# Patient Record
Sex: Female | Born: 1955 | Race: Black or African American | Hispanic: No | Marital: Married | Smoking: Current every day smoker
Health system: Southern US, Community
[De-identification: ages and names within clinical notes are randomized; demographics above are authoritative.]

## PROBLEM LIST (undated history)

## (undated) DIAGNOSIS — W19XXXA Unspecified fall, initial encounter: Secondary | ICD-10-CM

## (undated) DIAGNOSIS — Y92009 Unspecified place in unspecified non-institutional (private) residence as the place of occurrence of the external cause: Secondary | ICD-10-CM

## (undated) DIAGNOSIS — F250 Schizoaffective disorder, bipolar type: Secondary | ICD-10-CM

## (undated) DIAGNOSIS — K859 Acute pancreatitis without necrosis or infection, unspecified: Secondary | ICD-10-CM

## (undated) DIAGNOSIS — F79 Unspecified intellectual disabilities: Secondary | ICD-10-CM

## (undated) DIAGNOSIS — I10 Essential (primary) hypertension: Secondary | ICD-10-CM

## (undated) DIAGNOSIS — F319 Bipolar disorder, unspecified: Secondary | ICD-10-CM

## (undated) DIAGNOSIS — J449 Chronic obstructive pulmonary disease, unspecified: Secondary | ICD-10-CM

## (undated) HISTORY — PX: OTHER SURGICAL HISTORY: SHX169

## (undated) HISTORY — DX: Unspecified fall, initial encounter: W19.XXXA

## (undated) HISTORY — DX: Unspecified fall, initial encounter: Y92.009

## (undated) HISTORY — DX: Unspecified intellectual disabilities: F79

## (undated) HISTORY — DX: Chronic obstructive pulmonary disease, unspecified: J44.9

---

## 2004-04-04 ENCOUNTER — Ambulatory Visit: Payer: Self-pay | Admitting: Family Medicine

## 2011-01-17 ENCOUNTER — Ambulatory Visit (HOSPITAL_COMMUNITY)
Admission: RE | Admit: 2011-01-17 | Discharge: 2011-01-17 | Disposition: A | Discharge status: Home / Self Care | Payer: Medicaid Other | Attending: Psychiatry | Admitting: Psychiatry

## 2011-01-17 DIAGNOSIS — F39 Unspecified mood [affective] disorder: Secondary | ICD-10-CM | POA: Insufficient documentation

## 2011-03-23 ENCOUNTER — Emergency Department (HOSPITAL_COMMUNITY)
Admission: EM | Admit: 2011-03-23 | Discharge: 2011-03-23 | Disposition: A | Discharge status: Home / Self Care | Payer: Medicaid Other | Attending: Emergency Medicine | Admitting: Emergency Medicine

## 2011-03-23 ENCOUNTER — Encounter: Payer: Self-pay | Admitting: Emergency Medicine

## 2011-03-23 DIAGNOSIS — F29 Unspecified psychosis not due to a substance or known physiological condition: Secondary | ICD-10-CM | POA: Insufficient documentation

## 2011-03-23 HISTORY — DX: Schizoaffective disorder, bipolar type: F25.0

## 2011-03-23 HISTORY — DX: Bipolar disorder, unspecified: F31.9

## 2011-03-23 MED ORDER — HALOPERIDOL DECANOATE 100 MG/ML IM SOLN
100.0000 mg | Freq: Once | INTRAMUSCULAR | Status: AC
  Administered 2011-03-23: 100 mg via INTRAMUSCULAR
  Filled 2011-03-23: qty 1

## 2011-03-23 NOTE — ED Provider Notes (Signed)
History     CSN: [id number] Arrival date & time: 03/23/2011  2:15 PM   First MD Initiated Contact with Patient 03/23/11 1500      Chief Complaint  Patient presents with  . Medication Problem    Pt here for dose of Haldol    (Consider location/radiation/quality/duration/timing/severity/associated sxs/prior treatment) HPI The patient was seen by her psychiatrist today and was prescribed haloperidol decanoate to be started today.  Patient to the pharmacy and it was unavailable and thus the patient was asked by her psychiatrist come to the ER for an IM injection of the haloperidol.  The psychiatrist asked that no further evaluation be done as he is following this as an outpatient.  The patient is with her daughter who reports safety at home.  No other complaints  Past Medical History  Diagnosis Date  . Diabetes mellitus   . Bipolar 1 disorder   . Schizoaffective disorder, bipolar type     History reviewed. No pertinent past surgical history.  History reviewed. No pertinent family history.  History  Substance Use Topics  . Smoking status: Current Everyday Smoker  . Smokeless tobacco: Never Used  . Alcohol Use: Yes    OB History    Grav Para Term Preterm Abortions TAB SAB Ect Mult Living                  Review of Systems  All other systems reviewed and are negative.    Allergies  Review of patient's allergies indicates no known allergies.  Home Medications   Current Outpatient Rx  Name Route Sig Dispense Refill  . ACETAMINOPHEN 500 MG PO TABS Oral Take 1,000 mg by mouth 2 (two) times daily as needed. For back pain.     . ARIPIPRAZOLE 5 MG PO TABS Oral Take 5 mg by mouth 2 (two) times daily.      Marland Kitchen LORAZEPAM 1 MG PO TABS Oral Take 1 mg by mouth every 8 (eight) hours.      . TRAZODONE HCL 150 MG PO TABS Oral Take 150 mg by mouth at bedtime.        BP 154/101  Pulse 89  Temp(Src) 98.5 F (36.9 C) (Oral)  Resp 20  SpO2 96%  Physical Exam  Constitutional:  She is oriented to person, place, and time. She appears well-developed and well-nourished.  HENT:  Head: Normocephalic.  Eyes: EOM are normal.  Neck: Normal range of motion.  Pulmonary/Chest: Effort normal.  Musculoskeletal: Normal range of motion.  Neurological: She is alert and oriented to person, place, and time.  Psychiatric: She has a normal mood and affect.    ED Course  Procedures (including critical care time)  Labs Reviewed - No data to display No results found.   1. Psychosis       MDM  100 IM haloperidol Deconate was given as requested by her psychiatrist. Patient will be discharged home in the care of her family        Lyanne Co, MD 03/23/11 1610

## 2011-03-23 NOTE — ED Notes (Signed)
Pt sent here by [facility] for Haldol. Pt attempted to get medication from pharmacy but the pharmacy did not have it.

## 2011-05-06 ENCOUNTER — Emergency Department (HOSPITAL_COMMUNITY)
Admission: EM | Admit: 2011-05-06 | Discharge: 2011-05-07 | Disposition: A | Discharge status: Other Acute Inpatient Hospital | Payer: Medicaid Other | Attending: Internal Medicine | Admitting: Internal Medicine

## 2011-05-06 ENCOUNTER — Encounter (HOSPITAL_COMMUNITY): Payer: Self-pay | Admitting: Emergency Medicine

## 2011-05-06 DIAGNOSIS — R42 Dizziness and giddiness: Secondary | ICD-10-CM | POA: Diagnosis not present

## 2011-05-06 DIAGNOSIS — Z79899 Other long term (current) drug therapy: Secondary | ICD-10-CM

## 2011-05-06 DIAGNOSIS — Z5900 Homelessness unspecified: Secondary | ICD-10-CM

## 2011-05-06 DIAGNOSIS — I10 Essential (primary) hypertension: Secondary | ICD-10-CM | POA: Diagnosis present

## 2011-05-06 DIAGNOSIS — F259 Schizoaffective disorder, unspecified: Secondary | ICD-10-CM | POA: Diagnosis present

## 2011-05-06 DIAGNOSIS — F209 Schizophrenia, unspecified: Secondary | ICD-10-CM | POA: Insufficient documentation

## 2011-05-06 DIAGNOSIS — F411 Generalized anxiety disorder: Secondary | ICD-10-CM | POA: Insufficient documentation

## 2011-05-06 DIAGNOSIS — A088 Other specified intestinal infections: Secondary | ICD-10-CM | POA: Diagnosis present

## 2011-05-06 DIAGNOSIS — F319 Bipolar disorder, unspecified: Secondary | ICD-10-CM | POA: Diagnosis present

## 2011-05-06 DIAGNOSIS — K862 Cyst of pancreas: Principal | ICD-10-CM | POA: Diagnosis present

## 2011-05-06 DIAGNOSIS — F1020 Alcohol dependence, uncomplicated: Secondary | ICD-10-CM | POA: Diagnosis present

## 2011-05-06 DIAGNOSIS — E119 Type 2 diabetes mellitus without complications: Secondary | ICD-10-CM | POA: Insufficient documentation

## 2011-05-06 DIAGNOSIS — J069 Acute upper respiratory infection, unspecified: Secondary | ICD-10-CM | POA: Diagnosis present

## 2011-05-06 DIAGNOSIS — F79 Unspecified intellectual disabilities: Secondary | ICD-10-CM | POA: Diagnosis present

## 2011-05-06 DIAGNOSIS — F1210 Cannabis abuse, uncomplicated: Secondary | ICD-10-CM | POA: Diagnosis present

## 2011-05-06 DIAGNOSIS — F17200 Nicotine dependence, unspecified, uncomplicated: Secondary | ICD-10-CM | POA: Diagnosis present

## 2011-05-06 DIAGNOSIS — K859 Acute pancreatitis without necrosis or infection, unspecified: Secondary | ICD-10-CM | POA: Diagnosis present

## 2011-05-06 LAB — COMPREHENSIVE METABOLIC PANEL
ALT: 25 U/L (ref 0–35)
AST: 27 U/L (ref 0–37)
Albumin: 4 g/dL (ref 3.5–5.2)
Alkaline Phosphatase: 80 U/L (ref 39–117)
BUN: 8 mg/dL (ref 6–23)
CO2: 28 mEq/L (ref 19–32)
Calcium: 10.1 mg/dL (ref 8.4–10.5)
Chloride: 99 mEq/L (ref 96–112)
Creatinine, Ser: 0.91 mg/dL (ref 0.50–1.10)
GFR calc Af Amer: 80 mL/min — ABNORMAL LOW (ref >90)
GFR calc non Af Amer: 69 mL/min — ABNORMAL LOW (ref >90)
Glucose, Bld: 115 mg/dL — ABNORMAL HIGH (ref 70–99)
Potassium: 3.5 mEq/L (ref 3.5–5.1)
Sodium: 137 mEq/L (ref 135–145)
Total Bilirubin: 0.6 mg/dL (ref 0.3–1.2)
Total Protein: 7.5 g/dL (ref 6.0–8.3)

## 2011-05-06 LAB — ETHANOL: Alcohol, Ethyl (B): <11 mg/dL (ref 0–11)

## 2011-05-06 LAB — CBC
HCT: 42.3 % (ref 36.0–46.0)
Hemoglobin: 15.4 g/dL — ABNORMAL HIGH (ref 12.0–15.0)
MCH: 30.6 pg (ref 26.0–34.0)
MCHC: 36.4 g/dL — ABNORMAL HIGH (ref 30.0–36.0)
MCV: 83.9 fL (ref 78.0–100.0)
Platelets: 254 10*3/uL (ref 150–400)
RBC: 5.04 MIL/uL (ref 3.87–5.11)
RDW: 13 % (ref 11.5–15.5)
WBC: 7.2 10*3/uL (ref 4.0–10.5)

## 2011-05-06 LAB — DIFFERENTIAL
Eosinophils Relative: 1 % (ref 0–5)
Lymphocytes Relative: 46 % (ref 12–46)
Monocytes Absolute: 0.3 10*3/uL (ref 0.1–1.0)
Monocytes Relative: 5 % (ref 3–12)
Neutro Abs: 3.4 10*3/uL (ref 1.7–7.7)

## 2011-05-06 LAB — RAPID URINE DRUG SCREEN, HOSP PERFORMED
Barbiturates: NOT DETECTED
Benzodiazepines: NOT DETECTED
Cocaine: NOT DETECTED

## 2011-05-06 LAB — ACETAMINOPHEN LEVEL: Acetaminophen (Tylenol), Serum: <15 ug/mL (ref 10–30)

## 2011-05-06 LAB — GLUCOSE, CAPILLARY: Glucose-Capillary: 112 mg/dL — ABNORMAL HIGH (ref 70–99)

## 2011-05-06 LAB — URINALYSIS, ROUTINE W REFLEX MICROSCOPIC
Ketones, ur: NEGATIVE mg/dL
Leukocytes, UA: NEGATIVE
Nitrite: NEGATIVE
Protein, ur: NEGATIVE mg/dL
Urobilinogen, UA: 1 mg/dL (ref 0.0–1.0)

## 2011-05-06 MED ORDER — ARIPIPRAZOLE 5 MG PO TABS
5.0000 mg | ORAL_TABLET | Freq: Two times a day (BID) | ORAL | Status: DC
  Administered 2011-05-06 (×2): 5 mg via ORAL
  Filled 2011-05-06 (×4): qty 1

## 2011-05-06 MED ORDER — ACETAMINOPHEN 500 MG PO TABS: 1000.0000 mg | ORAL_TABLET | Freq: Two times a day (BID) | ORAL | Status: DC | PRN

## 2011-05-06 MED ORDER — ALUM & MAG HYDROXIDE-SIMETH 200-200-20 MG/5ML PO SUSP: 30.0000 mL | ORAL | Status: DC | PRN

## 2011-05-06 MED ORDER — LORAZEPAM 1 MG PO TABS
1.0000 mg | ORAL_TABLET | Freq: Three times a day (TID) | ORAL | Status: DC | PRN
  Administered 2011-05-06: 1 mg via ORAL
  Filled 2011-05-06: qty 1

## 2011-05-06 MED ORDER — ONDANSETRON HCL 4 MG PO TABS: 4.0000 mg | ORAL_TABLET | Freq: Three times a day (TID) | ORAL | Status: DC | PRN

## 2011-05-06 MED ORDER — INSULIN ASPART 100 UNIT/ML ~~LOC~~ SOLN
0.0000 [IU] | SUBCUTANEOUS | Status: DC
  Administered 2011-05-07: 2 [IU] via SUBCUTANEOUS
  Filled 2011-05-06: qty 3

## 2011-05-06 MED ORDER — HALOPERIDOL LACTATE 5 MG/ML IJ SOLN
5.0000 mg | Freq: Once | INTRAMUSCULAR | Status: AC
  Administered 2011-05-06: 5 mg via INTRAMUSCULAR
  Filled 2011-05-06: qty 1

## 2011-05-06 MED ORDER — TRAZODONE HCL 50 MG PO TABS
150.0000 mg | ORAL_TABLET | Freq: Every day | ORAL | Status: DC
  Administered 2011-05-06: 150 mg via ORAL
  Filled 2011-05-06: qty 1

## 2011-05-06 NOTE — ED Provider Notes (Signed)
History     CSN: [id number]  Arrival date & time 05/06/11  0865   First MD Initiated Contact with Patient 05/06/11 0606      Chief Complaint  Patient presents with  . Medical Clearance    (Consider location/radiation/quality/duration/timing/severity/associated sxs/prior treatment) HPI History is obtained from patient and [employer]. The patient was brought to the ED by [facility] officers; she has a history of bipolar and schizoaffective disorder and lives with her daughter. She apparently has not been taking her medications for about a week and a half; her behavior has caused her daughter to kick her out of the house. When asked why she has not been taking her medication, she states "this does kill me." States that she has had some suicidal ideation, but denies any plan. She has not had any homicidal ideation. Denies any hallucinations. She denies any physical complaints at this time.  No family members with pt. She does not present with IVC papers.  Past Medical History  Diagnosis Date  . Diabetes mellitus   . Bipolar 1 disorder   . Schizoaffective disorder, bipolar type     Past Surgical History  Procedure Date  . Arm surgery     History reviewed. No pertinent family history.  History  Substance Use Topics  . Smoking status: Current Everyday Smoker  . Smokeless tobacco: Never Used  . Alcohol Use: Yes    OB History    Grav Para Term Preterm Abortions TAB SAB Ect Mult Living                  Review of Systems  Constitutional: Negative for fever, chills, activity change and appetite change.  HENT: Negative for congestion, sore throat, rhinorrhea and postnasal drip.   Respiratory: Negative for cough.   Cardiovascular: Negative for chest pain and palpitations.  Gastrointestinal: Negative for nausea, vomiting and abdominal pain.  Psychiatric/Behavioral: Positive for suicidal ideas and behavioral problems. Negative for hallucinations, sleep disturbance and self-injury. The patient  is nervous/anxious.     Allergies  Review of patient's allergies indicates no known allergies.  Home Medications   Current Outpatient Rx  Name Route Sig Dispense Refill  . ACETAMINOPHEN 500 MG PO TABS Oral Take 1,000 mg by mouth 2 (two) times daily as needed. For back pain.     . ARIPIPRAZOLE 5 MG PO TABS Oral Take 5 mg by mouth 2 (two) times daily.      Marland Kitchen LORAZEPAM 1 MG PO TABS Oral Take 1 mg by mouth every 8 (eight) hours.      . TRAZODONE HCL 150 MG PO TABS Oral Take 150 mg by mouth at bedtime.        BP 173/108  Pulse 88  Temp(Src) 98.9 F (37.2 C) (Oral)  Resp 20  SpO2 97%  Physical Exam  Nursing note and vitals reviewed. Constitutional: She appears well-developed and well-nourished. No distress.  HENT:  Head: Normocephalic and atraumatic.  Right Ear: External ear normal.  Left Ear: External ear normal.  Eyes: Conjunctivae are normal. Pupils are equal, round, and reactive to light.  Neck: Normal range of motion. Neck supple.  Cardiovascular: Normal rate, regular rhythm and normal heart sounds.   Pulmonary/Chest: Effort normal and breath sounds normal.  Abdominal: Soft. Bowel sounds are normal. There is no tenderness. There is no rebound.  Neurological: She is alert.  Skin: Skin is warm and dry. No rash noted. She is not diaphoretic.  Psychiatric: Her mood appears anxious. Her speech is rapid and/or  pressured. She is agitated. She is not aggressive, is not hyperactive and not combative. She expresses impulsivity and inappropriate judgment. She expresses suicidal ideation. She expresses no homicidal ideation. She expresses no suicidal plans and no homicidal plans.       Paranoid    ED Course  Procedures (including critical care time)  Labs Reviewed  CBC - Abnormal; Notable for the following:    Hemoglobin 15.4 (*)    MCHC 36.4 (*)    All other components within normal limits  COMPREHENSIVE METABOLIC PANEL - Abnormal; Notable for the following:    Glucose, Bld 115  (*)    GFR calc non Af Amer 69 (*)    GFR calc Af Amer 80 (*)    All other components within normal limits  DIFFERENTIAL  ACETAMINOPHEN LEVEL  URINALYSIS, ROUTINE W REFLEX MICROSCOPIC  ETHANOL  URINE RAPID DRUG SCREEN (HOSP PERFORMED)   No results found.   No diagnosis found.    MDM  9:16 AM Patient was cleared from a medical standpoint. She was moved to bed 10 as there were no psych ED beds available. She was attempting to leave the room, making inappropriate comments, and perseverating on a religious theme. She made the comment that "doctors are the devil" and generally seems paranoid. Dr. Fonnie Jarvis saw the pt; as she has not been taking her medication, appears to be psychotic and has no family members with her, IVC papers have been completed. Haldol and sitter ordered. Call placed to [facility].  9:43 AM Received callback from [facility] with [facility]. She will assess. Pt to be moved to psych ED when bed available.    Grant Fontana, [place] 05/06/11 1216   Medical screening examination/treatment/procedure(s) were performed by non-physician practitioner and as supervising physician I was immediately available for consultation/collaboration.   Sunnie Nielsen, MD 05/06/11 [phone]

## 2011-05-06 NOTE — ED Notes (Signed)
Pt brought to the ER by [facility] officers, pt lives w/ daughter, per [employer] daughter has kicked pt out of her home. Pt has hx of schizophrenia and bipolar disorder, Hx of diabetes, she has not taken home meds since past Wed. Pt states that "those pills kill me". Pt denies any SI/HI ideations, states " i am a good girl", "I believe in Jesus"

## 2011-05-06 NOTE — ED Notes (Signed)
Pt. Easily excitable---appears manic---singing, dancing---tech in with patient to keep her in treatment area.  Tech with very good rapport with patient and assist in calming her.

## 2011-05-06 NOTE — ED Notes (Signed)
Received pt from Evangeline Gula Pt oriented to unit snack given

## 2011-05-06 NOTE — ED Notes (Signed)
Report called to [facility] and pt. Ambulated down to [facility] ED.  Belongings taken--given to nurse.

## 2011-05-06 NOTE — BH Assessment (Signed)
Assessment Note   [name] is an 55 y.o. female with a lengthy history of BiPolar and Schizoaffective Disorder.  Pt was brought in by [employer] after daughter alledgedly kicked pt out of the home.  Pt has refused her medication for over a week and is abusing ETOH and Cannibus.  Pt is now paranoid, agitated, and unable to contract for safety. Pt is not oriented and has been placed on IVC.  Pt is eating and drinking, but was dancing and becoming agitated in her room earlier and was given Geodon.  When assessed, pt was resting quietly in her room.  Pt denies SI or HI.  Pt is a poor historian regarding past hx of treatment though currently seen, is not compliant.   Pt is in need of inpatient treatment to address current symptoms.  Please run.  Axis I: Bipolar, mixed and Schizoaffective Disorder Axis II: Deferred Axis III:  Past Medical History  Diagnosis Date  . Diabetes mellitus   . Bipolar 1 disorder   . Schizoaffective disorder, bipolar type    Axis IV: other psychosocial or environmental problems, problems related to social environment and problems with primary support group Axis V: 21-30 behavior considerably influenced by delusions or hallucinations OR serious impairment in judgment, communication OR inability to function in almost all areas  Past Medical History:  Past Medical History  Diagnosis Date  . Diabetes mellitus   . Bipolar 1 disorder   . Schizoaffective disorder, bipolar type     Past Surgical History  Procedure Date  . Arm surgery     Family History: History reviewed. No pertinent family history.  Social History:  reports that she has been smoking.  She has never used smokeless tobacco. She reports that she drinks alcohol. She reports that she uses illicit drugs (Marijuana).  Additional Social History:    Allergies: No Known Allergies  Home Medications:  Medications Prior to Admission  Medication Dose Route Frequency Provider Last Rate Last Dose  . acetaminophen  (TYLENOL) tablet 1,000 mg  1,000 mg Oral BID PRN Grant Fontana, PA      . alum & mag hydroxide-simeth (MAALOX/MYLANTA) 200-200-20 MG/5ML suspension 30 mL  30 mL Oral PRN Grant Fontana, PA      . ARIPiprazole (ABILIFY) tablet 5 mg  5 mg Oral BID Grant Fontana, PA   5 mg at 05/06/11 0959  . haloperidol lactate (HALDOL) injection 5 mg  5 mg Intramuscular Once Hurman Horn, MD   5 mg at 05/06/11 1610  . LORazepam (ATIVAN) tablet 1 mg  1 mg Oral Q8H PRN Grant Fontana, PA   1 mg at 05/06/11 1028  . ondansetron (ZOFRAN) tablet 4 mg  4 mg Oral Q8H PRN Grant Fontana, PA      . traZODone (DESYREL) tablet 150 mg  150 mg Oral QHS Grant Fontana, [place]       Medications Prior to Admission  Medication Sig Dispense Refill  . acetaminophen (TYLENOL) 500 MG tablet Take 1,000 mg by mouth 2 (two) times daily as needed. For back pain.       . ARIPiprazole (ABILIFY) 5 MG tablet Take 5 mg by mouth 2 (two) times daily.        Marland Kitchen LORazepam (ATIVAN) 1 MG tablet Take 1 mg by mouth every 8 (eight) hours.        . traZODone (DESYREL) 150 MG tablet Take 150 mg by mouth at bedtime.          OB/GYN Status:  No  LMP recorded. Patient is postmenopausal.                 Mental Status Report Motor Activity: Agitation;Restlessness                     Values / Beliefs Cultural Requests During Hospitalization: None Spiritual Requests During Hospitalization: None              Disposition:     On Site Evaluation by:   Reviewed with Physician:     Angelica Ran 05/06/2011 2:12 PM

## 2011-05-06 NOTE — Progress Notes (Signed)
Addendum to [facility] Note:  Pt's Community Support Lead from [employer] is present.  Her name is Melynda Keller and she can reached at [phone].  Ms. Harlene Salts informs Clinical research associate that she's been non-compliant with her medication "for some time now and she's drinking alcohol daily."  She receives her medication management through [facility]  Ms. Langhorne further reports that she has a screening through [facility] scheduled for Wednesday, January 2nd and that they are also looking for long-term placement for her, as she is too acute to live on her own.  Pt's daughter is unable to handle pt any longer as well.  Verdene Lennert Aleera Gilcrease, LPC 1515

## 2011-05-06 NOTE — ED Notes (Signed)
Pt belongings placed in activity room with locked door. Three bags

## 2011-05-06 NOTE — ED Notes (Signed)
Pt wearing blanket on head, has turned keyboard upside down, making inappropriate comments.

## 2011-05-06 NOTE — ED Notes (Signed)
Pt. Up in room--dancing--walking into pt. Next doors room---taken back to her treatment room--TV on and channel changed to pt's choice

## 2011-05-06 NOTE — ED Notes (Signed)
Pt. Sleeping on carrier--awakens easily with verbal and up to bathroom--back to carrier.  Tech with patient

## 2011-05-06 NOTE — ED Provider Notes (Signed)
This 55 year old female has a history of bipolar disorder as well as schizophrenia and self-admittedly is noncompliant with medications, apparently her daughter called the police to bring the patient emergency room, the patient is anxious paranoid delusional and psychotic believing that her psychiatrist and psychiatrist other double, that the doctors are trying to kill her and any shots but the doctors may give her will try to kill her, she told the physician assistant the patient was suicidal but now denies that to me she denies any threats or herself or others however she has very poor insight and poor judgment and does not understand her need for treatment, she refuses to take any oral medicines or injections thinking that we are trying to kill her. She is perseverating on religious conversations. Because she wants to leave the emergency department and I cannot guarantee her safety and there is no family with her I believe involuntary commitment is reasonable and I completed a petition at this time to keep the patient in the emergency department.1610  Medical screening examination/treatment/procedure(s) were conducted as a shared visit with non-physician practitioner(s) and myself.  I personally evaluated the patient during the encounter  Hurman Horn, MD 05/07/11 [phone]

## 2011-05-07 ENCOUNTER — Inpatient Hospital Stay (HOSPITAL_COMMUNITY)
Admission: EM | Admit: 2011-05-07 | Discharge: 2011-05-10 | DRG: 438 | Disposition: A | Discharge status: Other Acute Inpatient Hospital | Payer: Medicaid Other | Attending: Internal Medicine | Admitting: Internal Medicine

## 2011-05-07 ENCOUNTER — Encounter (HOSPITAL_COMMUNITY): Payer: Self-pay | Admitting: *Deleted

## 2011-05-07 DIAGNOSIS — R112 Nausea with vomiting, unspecified: Secondary | ICD-10-CM | POA: Diagnosis present

## 2011-05-07 DIAGNOSIS — R42 Dizziness and giddiness: Secondary | ICD-10-CM | POA: Diagnosis present

## 2011-05-07 DIAGNOSIS — K859 Acute pancreatitis without necrosis or infection, unspecified: Secondary | ICD-10-CM | POA: Diagnosis present

## 2011-05-07 DIAGNOSIS — F250 Schizoaffective disorder, bipolar type: Secondary | ICD-10-CM | POA: Diagnosis present

## 2011-05-07 DIAGNOSIS — R748 Abnormal levels of other serum enzymes: Secondary | ICD-10-CM | POA: Diagnosis present

## 2011-05-07 DIAGNOSIS — R1013 Epigastric pain: Secondary | ICD-10-CM | POA: Diagnosis present

## 2011-05-07 DIAGNOSIS — R509 Fever, unspecified: Secondary | ICD-10-CM

## 2011-05-07 DIAGNOSIS — J069 Acute upper respiratory infection, unspecified: Secondary | ICD-10-CM | POA: Diagnosis present

## 2011-05-07 DIAGNOSIS — F17200 Nicotine dependence, unspecified, uncomplicated: Secondary | ICD-10-CM | POA: Diagnosis present

## 2011-05-07 DIAGNOSIS — F1210 Cannabis abuse, uncomplicated: Secondary | ICD-10-CM | POA: Diagnosis present

## 2011-05-07 DIAGNOSIS — F1020 Alcohol dependence, uncomplicated: Secondary | ICD-10-CM | POA: Diagnosis present

## 2011-05-07 DIAGNOSIS — F79 Unspecified intellectual disabilities: Secondary | ICD-10-CM | POA: Diagnosis present

## 2011-05-07 DIAGNOSIS — E119 Type 2 diabetes mellitus without complications: Secondary | ICD-10-CM | POA: Diagnosis present

## 2011-05-07 DIAGNOSIS — R197 Diarrhea, unspecified: Secondary | ICD-10-CM

## 2011-05-07 DIAGNOSIS — F1994 Other psychoactive substance use, unspecified with psychoactive substance-induced mood disorder: Secondary | ICD-10-CM

## 2011-05-07 DIAGNOSIS — F1910 Other psychoactive substance abuse, uncomplicated: Secondary | ICD-10-CM | POA: Diagnosis present

## 2011-05-07 DIAGNOSIS — K863 Pseudocyst of pancreas: Secondary | ICD-10-CM | POA: Diagnosis present

## 2011-05-07 DIAGNOSIS — F329 Major depressive disorder, single episode, unspecified: Secondary | ICD-10-CM

## 2011-05-07 DIAGNOSIS — J029 Acute pharyngitis, unspecified: Secondary | ICD-10-CM | POA: Diagnosis present

## 2011-05-07 DIAGNOSIS — J9801 Acute bronchospasm: Secondary | ICD-10-CM | POA: Diagnosis present

## 2011-05-07 HISTORY — DX: Essential (primary) hypertension: I10

## 2011-05-07 LAB — HEMOGLOBIN A1C
Hgb A1c MFr Bld: 6.4 % — ABNORMAL HIGH (ref <5.7)
Mean Plasma Glucose: 137 mg/dL — ABNORMAL HIGH (ref <117)

## 2011-05-07 LAB — COMPREHENSIVE METABOLIC PANEL
Alkaline Phosphatase: 70 U/L (ref 39–117)
BUN: 13 mg/dL (ref 6–23)
Chloride: 98 mEq/L (ref 96–112)
GFR calc non Af Amer: 58 mL/min — ABNORMAL LOW (ref >90)
Glucose, Bld: 92 mg/dL (ref 70–99)
Potassium: 3.9 mEq/L (ref 3.5–5.1)
Total Bilirubin: 0.3 mg/dL (ref 0.3–1.2)

## 2011-05-07 LAB — GLUCOSE, CAPILLARY: Glucose-Capillary: 110 mg/dL — ABNORMAL HIGH (ref 70–99)

## 2011-05-07 MED ORDER — METFORMIN HCL 500 MG PO TABS
500.0000 mg | ORAL_TABLET | Freq: Every day | ORAL | Status: DC
  Administered 2011-05-08: 500 mg via ORAL
  Filled 2011-05-07 (×2): qty 1

## 2011-05-07 MED ORDER — VITAMIN B-1 100 MG PO TABS
100.0000 mg | ORAL_TABLET | Freq: Every day | ORAL | Status: DC
  Administered 2011-05-07 – 2011-05-10 (×4): 100 mg via ORAL
  Filled 2011-05-07 (×5): qty 1

## 2011-05-07 MED ORDER — ADULT MULTIVITAMIN W/MINERALS CH
1.0000 | ORAL_TABLET | Freq: Every day | ORAL | Status: DC
  Administered 2011-05-07 – 2011-05-10 (×4): 1 via ORAL
  Filled 2011-05-07 (×4): qty 1

## 2011-05-07 MED ORDER — THIAMINE HCL 100 MG/ML IJ SOLN
100.0000 mg | Freq: Every day | INTRAMUSCULAR | Status: DC
  Administered 2011-05-07: 100 mg via INTRAVENOUS

## 2011-05-07 MED ORDER — INFLUENZA VIRUS VACC SPLIT PF IM SUSP: 0.5000 mL | INTRAMUSCULAR | Status: AC

## 2011-05-07 MED ORDER — ALUM & MAG HYDROXIDE-SIMETH 200-200-20 MG/5ML PO SUSP: 30.0000 mL | ORAL | Status: DC | PRN

## 2011-05-07 MED ORDER — LISINOPRIL 10 MG PO TABS
10.0000 mg | ORAL_TABLET | Freq: Every day | ORAL | Status: DC
  Administered 2011-05-07 – 2011-05-15 (×9): 10 mg via ORAL
  Filled 2011-05-07 (×11): qty 1

## 2011-05-07 MED ORDER — HALOPERIDOL LACTATE 5 MG/ML IJ SOLN
5.0000 mg | Freq: Once | INTRAMUSCULAR | Status: AC
  Administered 2011-05-07: 5 mg via INTRAMUSCULAR
  Filled 2011-05-07: qty 1

## 2011-05-07 MED ORDER — LORAZEPAM 1 MG PO TABS: 1.0000 mg | ORAL_TABLET | ORAL | Status: DC

## 2011-05-07 MED ORDER — ACETAMINOPHEN 325 MG PO TABS
650.0000 mg | ORAL_TABLET | Freq: Four times a day (QID) | ORAL | Status: DC | PRN
Start: 2011-05-07 — End: 2011-05-15
  Administered 2011-05-09 – 2011-05-13 (×5): 650 mg via ORAL
  Filled 2011-05-07 (×4): qty 2

## 2011-05-07 MED ORDER — MAGNESIUM HYDROXIDE 400 MG/5ML PO SUSP: 30.0000 mL | Freq: Every day | ORAL | Status: DC | PRN

## 2011-05-07 MED ORDER — ARIPIPRAZOLE 5 MG PO TABS: 5.0000 mg | ORAL_TABLET | Freq: Two times a day (BID) | ORAL | Status: DC

## 2011-05-07 MED ORDER — LORAZEPAM 1 MG PO TABS
1.0000 mg | ORAL_TABLET | ORAL | Status: DC
  Administered 2011-05-07 – 2011-05-08 (×3): 1 mg via ORAL
  Filled 2011-05-07 (×3): qty 1

## 2011-05-07 MED ORDER — LORAZEPAM 1 MG PO TABS: 1.0000 mg | ORAL_TABLET | Freq: Three times a day (TID) | ORAL | Status: DC

## 2011-05-07 MED ORDER — GABAPENTIN 100 MG PO CAPS
200.0000 mg | ORAL_CAPSULE | ORAL | Status: DC
  Administered 2011-05-07 – 2011-05-10 (×9): 200 mg via ORAL
  Filled 2011-05-07 (×15): qty 2

## 2011-05-07 MED ORDER — TRAZODONE HCL 50 MG PO TABS
150.0000 mg | ORAL_TABLET | Freq: Every day | ORAL | Status: DC
  Administered 2011-05-07 – 2011-05-14 (×8): 150 mg via ORAL
  Filled 2011-05-07 (×10): qty 3

## 2011-05-07 MED ORDER — ARIPIPRAZOLE 5 MG PO TABS
5.0000 mg | ORAL_TABLET | ORAL | Status: DC
  Administered 2011-05-07 – 2011-05-15 (×17): 5 mg via ORAL
  Filled 2011-05-07 (×23): qty 1

## 2011-05-07 NOTE — Tx Team (Signed)
Initial Interdisciplinary Treatment Plan  PATIENT STRENGTHS: (choose at least two) Physical Health  PATIENT STRESSORS: Medication change or noncompliance Substance abuse   PROBLEM LIST: Problem List/Patient Goals Date to be addressed Date deferred Reason deferred Estimated date of resolution  Psychosis/confusion 05/07/11     Substance abuse 05/07/11                                                DISCHARGE CRITERIA:  Adequate post-discharge living arrangements Improved stabilization in mood, thinking, and/or behavior Motivation to continue treatment in a less acute level of care Need for constant or close observation no longer present Verbal commitment to aftercare and medication compliance  PRELIMINARY DISCHARGE PLAN: Return to previous living arrangement  PATIENT/FAMIILY INVOLVEMENT: This treatment plan has been presented to and reviewed with the patient, [name], and/or family member,  The patient and family have been given the opportunity to ask questions and make suggestions.  Beatrix Shipper 05/07/2011, 12:22 PM

## 2011-05-07 NOTE — H&P (Signed)
Psychiatric Admission Assessment Adult  Patient Identification:  Verlin Duke Date of Evaluation:  05/07/2011  56yo DAAF History of Present Illness:: Non-compliant with prescribed meds abusing ETOH & THC.[employer] brought to ED after daughter"kicked her out" was paranoid agitated and unable to contract for safety.Is still not oriented. Is felt to be an unreliable historian.    Past Psychiatric History: will have to obtain collateral information  Substance Abuse History:  Social History:    reports that she has been smoking Cigarettes.  She has a 5 pack-year smoking history. She has never used smokeless tobacco. She reports that she drinks alcohol. She reports that she uses illicit drugs (Marijuana). UDS+THC no measuarable alcohol.  CAn't tellme how far she went in school [employer] says one daughter about 33.Gets SSI and does yard work. Family Psych History:Denies  Past Medical History:     Past Medical History  Diagnosis Date  . Diabetes mellitus   . Bipolar 1 disorder   . Schizoaffective disorder, bipolar type   . Hypertension        Past Surgical History  Procedure Date  . Arm surgery   was cut ofn R forearm playing ball  Allergies: No Known Allergies  Current Medications:  Prior to Admission medications   Medication Sig Start Date End Date Taking? Authorizing Provider  ARIPiprazole (ABILIFY) 5 MG tablet Take 5 mg by mouth 2 (two) times daily.      Historical Provider, MD  LORazepam (ATIVAN) 1 MG tablet Take 1 mg by mouth every 8 (eight) hours.      Historical Provider, MD  traZODone (DESYREL) 150 MG tablet Take 150 mg by mouth at bedtime.     Historical Provider, MD    Mental Status Examination/Evaluation: Objective:  Appearance: Fairly Groomed  Psychomotor Activity:  Increased  Eye Contact::  Fair  Speech:  Pressured  Not clear or coherent at times   Volume:  Increased  Mood: anxiously depressed    Affect:  Congruent  Thought Process: not clear rational or goal oriented     Orientation:  Other:  not oriented at all   Thought Content:  Not clear rational or goal oriented   Suicidal Thoughts:  No  Homicidal Thoughts:  No  Judgement:  Impaired  Insight:  Lacking    DIAGNOSIS:    AXIS I Schizoaffective Disorder Substance induced mood disorder   AXIS II Mental retardation, severity unknown  AXIS III See medical history.  AXIS IV Chronic substance abuse   AXIS V 31-40 impairment in reality testing     Treatment Plan Summary: Admit for safety and stabilization. Restart meds adjust as indicated Case manager check with daughter about placement at discharge.  Patient is edentulous otherwise agree with H&P from ED  [facility] PA-C

## 2011-05-07 NOTE — Progress Notes (Signed)
Pt admitted involuntary and has been on and off medications. ED reports pt was kicked out of daughter's house after stopped taking medications. Pt reports staying in a church. Medical hx of hypertension and diabetes. Pt is confused making statements as "no pills, pill will jump through water."

## 2011-05-07 NOTE — Tx Team (Signed)
Interdisciplinary Treatment Plan Update (Adult)  Date:  05/07/2011  Time Reviewed:  11:30AM   Progress in Treatment: Attending groups:  New Participating in groups:    Just arrived Taking medication as prescribed:    Just arrived Tolerating medication:   Just arrived Family/Significant other contact made:  Just arrived, will be needed with [facility] and possibly daughter Patient understands diagnosis:   No Discussing patient identified problems/goals with staff:   Yes Medical problems stabilized or resolved:   Just arrived Denies suicidal/homicidal ideation:  Yes Issues/concerns per patient self-inventory:   Just arrived Other:  New problem(s) identified: No, Describe:    Reason for Continuation of Hospitalization: Delusions  Hallucinations Medication stabilization Other; describe Placement  Interventions implemented related to continuation of hospitalization:  Medication monitoring and adjustment, safety checks Q15 min., group therapy, psychoeducation, collateral contact  Additional comments:  Not applicable  Estimated length of stay:  3-5 days  Discharge Plan:  Seek placement, possibly rehabilitation facility or assisted living facility  New goal(s):  Not applicable  Review of initial/current patient goals per problem list:   1.  Goal(s):  Reduce psychotic symptoms to baseline.  Met:  No  Target date:  By Discharge   As evidenced by:  Just arrived  2.  Goal(s):  Decide if/how to address substance abuse issues  Met:  No  Target date:  By Discharge   As evidenced by:  Just arrived   3.  Goal(s):  Determine appropriate placement at discharge  Met:  No  Target date:  By Discharge   As evidenced by:  Just arrived     Attendees: Patient:  [name]  05/07/2011 11:30AM  Family:     Physician:  Dr. Harvie Heck Readling 05/07/2011 11:30AM  Nursing:   Robbie Louis, RN 05/07/2011   11:30AM  Case Manager:  Ambrose Mantle, LCSW 05/07/2011  11:30AM  Counselor:         Other:      Other:      Other:      Other:       Scribe for Treatment Team:   Sarina Ser, 05/07/2011, 12:47 PM

## 2011-05-07 NOTE — Progress Notes (Signed)
Suicide Risk Assessment  Admission Assessment     Demographic factors:  Assessment Details Time of Assessment: Admission Information Obtained From: Patient Current Mental Status:  Current Mental Status:  (denies si and hi) Loss Factors:  Loss Factors:  (NA) Historical Factors:  Historical Factors: Impulsivity Risk Reduction Factors:  Risk Reduction Factors: Living with another person, especially a relative  CLINICAL FACTORS:  Schizoaffective Disorder - Bipolar Type Cannabis Abuse  COGNITIVE FEATURES THAT CONTRIBUTE TO RISK:  Loss of executive function    Diagnosis:  Axis I: Schizoaffective Disorder - Bipolar Type. Cannabis Abuse.  The patient was seen today and reports the following:   ADL's: Intact  Sleep: The patient reports to sleeping well without difficulty.  Appetite: The patient reports a good appetite.   Mild>(1-10) >Severe  Hopelessness (1-10): 0  Depression (1-10): 0   Anxiety (1-10): 0   Suicidal Ideation: The patient adamantly denies any current suicidal ideations.  Plan: No  Intent: No  Means: No   Homicidal Ideation: The patient adamantly denies any homicidal ideations today.  Plan: No  Intent: No.  Means: No   General Appearance /Behavior: Casual.  Eye Contact: Good.  Speech: Increased in rate and volume with much spontaneous speech. Motor Behavior: Mild to moderated hyperactive.  Level of Consciousness: Alert and Oriented x 3.  Mental Status: Alert and oriented x 3.  Mood: Mild to Moderately Manic.  Affect: Moderately Expansive.  Anxiety Level: None Reported.  Thought Process: Mild to moderate flight of ideas.  Thought Content: No auditory or visual hallucinations reported today. The patient appears confused and possibly delusional stating she is "taking a blue pill because she is wearing a blue jacket." Perception: Confused and possibly delusional.  Judgment: Poor.  Insight: Poor.  Cognition: Orientation to time, place and person.    Treatment Plan Summary:  1. Daily contact with patient to assess and evaluate symptoms and progress in treatment  2. Medication management  3. The patient will deny suicidal ideations or homicidal ideations for 48 hours prior to discharge and have a depression and anxiety rating of 3 or less. The patient will also deny any auditory or visual hallucinations or delusional thinking.   Plan:  1. Continue current home medications.  2. Continue to monitor.  3. Case Manager to obtain additional information for the patient's family concerning her condition.  SUICIDE RISK:   Minimal: No identifiable suicidal ideation.  Patients presenting with no risk factors but with morbid ruminations; may be classified as minimal risk based on the severity of the depressive symptoms  [name] 05/07/2011, 12:38 PM

## 2011-05-07 NOTE — ED Notes (Signed)
3 bag of belongings sent w/ pt.  Pt remains cooperative, pleasent

## 2011-05-07 NOTE — ED Notes (Signed)
Crackers/peanut butter given/diet soda

## 2011-05-07 NOTE — Discharge Planning (Signed)
Met with patient to determine discharge needs.  Patient has apparently been kicked out of her daughter's home, and needs long-term placement.  She has an appointment for assessment at [facility] tomorrow 05/07/10.  She has a long history of alcohol and drug abuse, is drinking alcohol daily.  Patient has been off her meds, by self report and by report of her [employer] provider from [facility], [place] [phone].  Medication management is also provided by [facility].  Case management services can be initiated tomorrow when holiday closings are finished.  Ambrose Mantle, LCSW 05/07/2011, 12:47 PM

## 2011-05-07 NOTE — ED Notes (Signed)
[facility] here to transport 

## 2011-05-08 LAB — GLUCOSE, CAPILLARY
Glucose-Capillary: 121 mg/dL — ABNORMAL HIGH (ref 70–99)
Glucose-Capillary: 144 mg/dL — ABNORMAL HIGH (ref 70–99)
Glucose-Capillary: 121 mg/dL — ABNORMAL HIGH (ref 70–99)
Glucose-Capillary: 127 mg/dL — ABNORMAL HIGH (ref 70–99)

## 2011-05-08 MED ORDER — METFORMIN HCL 500 MG PO TABS
500.0000 mg | ORAL_TABLET | Freq: Two times a day (BID) | ORAL | Status: DC
  Administered 2011-05-08 – 2011-05-10 (×4): 500 mg via ORAL
  Filled 2011-05-08 (×9): qty 1

## 2011-05-08 MED ORDER — CLONAZEPAM 0.5 MG PO TABS
0.5000 mg | ORAL_TABLET | ORAL | Status: DC
  Administered 2011-05-08 – 2011-05-10 (×6): 0.5 mg via ORAL
  Filled 2011-05-08 (×6): qty 1

## 2011-05-08 NOTE — Progress Notes (Signed)
Recreation Therapy Group Note  Date: 05/08/2011         Time: 0930      Group Topic/Focus: The focus of this group is on enhancing the patient's understanding of leisure, barriers to leisure, and the importance of engaging in positive leisure activities upon discharge for improved total health.  Participation Level: Minimal  Participation Quality: Intrusive  Affect: Excited  Cognitive: Confused   Additional Comments: Patient late to group, reporting she had taken a shower. Patient loud and intrusive, requiring frequent redirection, perseverating on topics.   Rastus Borton 05/08/2011 11:22 AM

## 2011-05-08 NOTE — Progress Notes (Signed)
Pt was up in dayroom upon first assessment.  She was able to fill out her self-inventory on her own.  She rated everything a 1 today.  She denied any A/V hallucinations but she seems to be responding to internal stimuli.  She is often noted to be whispering and laughing to herself.  She denied any S/H ideations.  She was to get the Flu vaccine but she refused to take.  She had to be coaxed to take her am medications.  She started to pick out the ones she wanted to take and was trying to refuse her vitamins and glucophage.  After a bit of explaining, she decided to take all her pills but still didn't want the flu vaccine.  Her new orders for today are klonopin 0.5mg  q am, 1400 and qhs which she had the 1400 dose today.  Her glucophage was changed to bid and her ativan was d/c'd.  Pt remains intrusive, tangential with FOI's.  She takes almost constant redirection.  She goes into groups but will get up and walk out and try to go back.  She took her 1400 medications without incident.  She does act child-like and seems to want a lot of attention.   Unsure where pt will live at discharge for her sister does not want her to come back.

## 2011-05-08 NOTE — Progress Notes (Signed)
[facility] MD Progress Note  05/08/2011 11:46 AM  Diagnosis:  Axis I: Schizoaffective Disorder - Bipolar Type.  Alcohol Abuse. Cannabis Abuse.   The patient was seen today and reports the following:   ADL's: Intact  Sleep: The patient reports to sleeping well without difficulty.  Appetite: The patient reports a good appetite.   Mild>(1-10) >Severe  Hopelessness (1-10): 0  Depression (1-10): 3  Anxiety (1-10): 0   Suicidal Ideation: The patient adamantly denies any current suicidal ideations.  Plan: No  Intent: No  Means: No   Homicidal Ideation: The patient adamantly denies any homicidal ideations today.  Plan: No  Intent: No.  Means: No   General Appearance /Behavior: Casual.  Eye Contact: Good.  Speech: Appropriate in rate and volume but with much spontaneous speech.  Motor Behavior: Mild to moderated hyperactive.  Level of Consciousness: Alert and Oriented x 3.  Mental Status: Alert and oriented x 3.  Mood: Mild to Moderately Manic.  Affect: Moderately Expansive.  Anxiety Level: None Reported.  Thought Process: Moderate flight of ideas.  Thought Content: No auditory or visual hallucinations reported today. The patient appears confused and possibly delusional. Perception: Confused and possibly delusional.  Judgment: Poor.  Insight: Poor.  Cognition: Orientation to time, place and person.  Sleep:  Number of Hours: 6.75   Vital Signs:Blood pressure 131/90, pulse 90, temperature 98.3 F (36.8 C), temperature source Oral, resp. rate 18, height 5\' 1"  (1.549 m), weight 65.318 kg (144 lb).  Lab Results:  Results for orders placed during the hospital encounter of 05/07/11 (from the past 48 hour(s))  GLUCOSE, CAPILLARY     Status: Abnormal   Collection Time   05/07/11 12:00 PM      Component Value Range Comment   Glucose-Capillary 139 (*) 70 - 99 (mg/dL)    Comment 1 Notify RN     GLUCOSE, CAPILLARY     Status: Abnormal   Collection Time   05/07/11  5:01 PM      Component  Value Range Comment   Glucose-Capillary 165 (*) 70 - 99 (mg/dL)   VITAMIN Q65     Status: Normal   Collection Time   05/07/11  7:54 PM      Component Value Range Comment   Vitamin B-12 618  211 - 911 (pg/mL)   COMPREHENSIVE METABOLIC PANEL     Status: Abnormal   Collection Time   05/07/11  7:54 PM      Component Value Range Comment   Sodium 135  135 - 145 (mEq/L)    Potassium 3.9  3.5 - 5.1 (mEq/L)    Chloride 98  96 - 112 (mEq/L)    CO2 30  19 - 32 (mEq/L)    Glucose, Bld 92  70 - 99 (mg/dL)    BUN 13  6 - 23 (mg/dL)    Creatinine, Ser 7.84  0.50 - 1.10 (mg/dL)    Calcium 9.9  8.4 - 10.5 (mg/dL)    Total Protein 6.4  6.0 - 8.3 (g/dL)    Albumin 3.5  3.5 - 5.2 (g/dL)    AST 25  0 - 37 (U/L)    ALT 21  0 - 35 (U/L)    Alkaline Phosphatase 70  39 - 117 (U/L)    Total Bilirubin 0.3  0.3 - 1.2 (mg/dL)    GFR calc non Af Amer 58 (*) >90 (mL/min)    GFR calc Af Amer 67 (*) >90 (mL/min)   GLUCOSE, CAPILLARY  Status: Abnormal   Collection Time   05/07/11  8:41 PM      Component Value Range Comment   Glucose-Capillary 110 (*) 70 - 99 (mg/dL)   GLUCOSE, CAPILLARY     Status: Abnormal   Collection Time   05/08/11  6:28 AM      Component Value Range Comment   Glucose-Capillary 121 (*) 70 - 99 (mg/dL)    Treatment Plan Summary:  1. Daily contact with patient to assess and evaluate symptoms and progress in treatment  2. Medication management  3. The patient will deny suicidal ideations or homicidal ideations for 48 hours prior to discharge and have a depression and anxiety rating of 3 or less. The patient will also deny any auditory or visual hallucinations or delusional thinking.   Plan:  1. Continue current medications.  2. Continue to monitor.  3. Case Manager to obtain additional information for the patient's family concerning her condition and baseline. 4. Possible placement needed.  Chamille Werntz 05/08/2011, 11:46 AM

## 2011-05-08 NOTE — Progress Notes (Signed)
Patient ID: [name], female   DOB: 02-01-1956, 56 y.o.   MRN: [id number]  Patient was pleasant and cooperative during the assessment. However, some of pt's conversation was incoherent due to rapid speech and missing teach. Pt informed the Clinical research associate that she is homeless and lives at [facility]. Stated, "the Waller family helps".  Writer asked pt about the rest of her family. Stated most of her family is in [place]. Support and encouragement was offered.

## 2011-05-09 ENCOUNTER — Inpatient Hospital Stay (HOSPITAL_COMMUNITY)
Admission: AD | Admit: 2011-05-09 | Discharge: 2011-05-09 | Disposition: A | Discharge status: Home / Self Care | Payer: Medicaid Other | Source: Ambulatory Visit | Attending: Psychiatry | Admitting: Psychiatry

## 2011-05-09 LAB — GLUCOSE, CAPILLARY: Glucose-Capillary: 132 mg/dL — ABNORMAL HIGH (ref 70–99)

## 2011-05-09 NOTE — Discharge Planning (Signed)
Met with patient in Aftercare Planning Group.   She was intrusive and inappropriate throughout, and difficult to redirect.  She did state prior to leaving group that she is concerned about what is going to happen to this month's check which normally goes to her daughter's house.  Case Manager agreed to work on finding daughter's telephone number.  Could call [facility], psychiatric care provider, if needed.  Will then have either Case Manager or Counselor to sit with patient to call daughter to figure out what will be done with check.  FL-2 will be developed to send out when placement is appropriate.  Ambrose Mantle, LCSW 05/09/2011, 10:37 AM

## 2011-05-09 NOTE — Progress Notes (Signed)
Patient ID: [name], female   DOB: [birth year], 56 y.o.   MRN: [id number]   Patient pleasant on approach. Thinking disorganized and going thru and trying on different clothes in her room. Some confusion noted at times. Took all meds except for her metformin stating that it makes her sick. Denies depressed mood or SI. Appears preoccupied at times but denies a/v hallucinations. Talking non-stop about family. Still need redirection and med stabilization prior to discharge. Staff will monitor and redirect as needed.

## 2011-05-09 NOTE — Progress Notes (Signed)
[facility] MD Progress Note  05/09/2011 9:53 AM  Diagnosis:  Axis I: Schizoaffective Disorder - Bipolar Type.  Alcohol Abuse.  Cannabis Abuse.   The patient was seen today and reports the following:   ADL's: Intact  Sleep: The patient reports to sleeping well without difficulty.  Appetite: The patient reports a good appetite.   Mild>(1-10) >Severe  Hopelessness (1-10): 0  Depression (1-10): 0  Anxiety (1-10): 0   Suicidal Ideation: The patient adamantly denies any current suicidal ideations.  Plan: No  Intent: No  Means: No   Homicidal Ideation: The patient adamantly denies any homicidal ideations today.  Plan: No  Intent: No.  Means: No   General Appearance /Behavior: Casual.  Eye Contact: Good.  Speech: Appropriate in rate and volume but with much spontaneous speech.  Motor Behavior: Mildly hyperactive.  Level of Consciousness: Alert and Oriented to place and person only.  Mental Status: Alert and oriented x 2 as above.  Mood: Mildly Manic.  Affect: Mildly Expansive.  Anxiety Level: None Reported.  Thought Process: Moderate flight of ideas.  Thought Content: No auditory or visual hallucinations reported today. The patient remains confused and possibly delusional.  Perception: Confused and possibly delusional.  Judgment: Poor.  Insight: Poor.  Cognition: Orientation to time, place and person.  Sleep:  Number of Hours: 4.25   Vital Signs:Blood pressure 144/89, pulse 83, temperature 98.1 F (36.7 C), temperature source Oral, resp. rate 18, height 5\' 1"  (1.549 m), weight 65.318 kg (144 lb).  Lab Results:  Results for orders placed during the hospital encounter of 05/07/11 (from the past 48 hour(s))  GLUCOSE, CAPILLARY     Status: Abnormal   Collection Time   05/07/11 12:00 PM      Component Value Range Comment   Glucose-Capillary 139 (*) 70 - 99 (mg/dL)    Comment 1 Notify RN     GLUCOSE, CAPILLARY     Status: Abnormal   Collection Time   05/07/11  5:01 PM      Component  Value Range Comment   Glucose-Capillary 165 (*) 70 - 99 (mg/dL)   VITAMIN W29     Status: Normal   Collection Time   05/07/11  7:54 PM      Component Value Range Comment   Vitamin B-12 618  211 - 911 (pg/mL)   FOLATE RBC     Status: Normal   Collection Time   05/07/11  7:54 PM      Component Value Range Comment   RBC Folate 505  >=366 (ng/mL) Reference range not established for pediatric patients.  COMPREHENSIVE METABOLIC PANEL     Status: Abnormal   Collection Time   05/07/11  7:54 PM      Component Value Range Comment   Sodium 135  135 - 145 (mEq/L)    Potassium 3.9  3.5 - 5.1 (mEq/L)    Chloride 98  96 - 112 (mEq/L)    CO2 30  19 - 32 (mEq/L)    Glucose, Bld 92  70 - 99 (mg/dL)    BUN 13  6 - 23 (mg/dL)    Creatinine, Ser 5.62  0.50 - 1.10 (mg/dL)    Calcium 9.9  8.4 - 10.5 (mg/dL)    Total Protein 6.4  6.0 - 8.3 (g/dL)    Albumin 3.5  3.5 - 5.2 (g/dL)    AST 25  0 - 37 (U/L)    ALT 21  0 - 35 (U/L)    Alkaline Phosphatase 70  39 - 117 (U/L)    Total Bilirubin 0.3  0.3 - 1.2 (mg/dL)    GFR calc non Af Amer 58 (*) >90 (mL/min)    GFR calc Af Amer 67 (*) >90 (mL/min)   GLUCOSE, CAPILLARY     Status: Abnormal   Collection Time   05/07/11  8:41 PM      Component Value Range Comment   Glucose-Capillary 110 (*) 70 - 99 (mg/dL)   GLUCOSE, CAPILLARY     Status: Abnormal   Collection Time   05/08/11  6:28 AM      Component Value Range Comment   Glucose-Capillary 121 (*) 70 - 99 (mg/dL)   GLUCOSE, CAPILLARY     Status: Abnormal   Collection Time   05/08/11 11:48 AM      Component Value Range Comment   Glucose-Capillary 144 (*) 70 - 99 (mg/dL)   GLUCOSE, CAPILLARY     Status: Abnormal   Collection Time   05/08/11  4:58 PM      Component Value Range Comment   Glucose-Capillary 121 (*) 70 - 99 (mg/dL)   GLUCOSE, CAPILLARY     Status: Abnormal   Collection Time   05/08/11  9:14 PM      Component Value Range Comment   Glucose-Capillary 127 (*) 70 - 99 (mg/dL)    Comment 1 Notify RN       GLUCOSE, CAPILLARY     Status: Abnormal   Collection Time   05/09/11  6:21 AM      Component Value Range Comment   Glucose-Capillary 135 (*) 70 - 99 (mg/dL)    Comment 1 Notify RN      Treatment Plan Summary:  1. Daily contact with patient to assess and evaluate symptoms and progress in treatment  2. Medication management  3. The patient will deny suicidal ideations or homicidal ideations for 48 hours prior to discharge and have a depression and anxiety rating of 3 or less. The patient will also deny any auditory or visual hallucinations or delusional thinking.   Plan:  1. Continue current medications.  2. Continue to monitor.  3. Case Manager to obtain additional information for the patient's family concerning her condition and baseline.  4. Will attempt to obtain a MRI today to evaluate the patient for a possible dementia. 5. Possible placement needed.  Randy Readling 05/09/2011, 9:53 AM

## 2011-05-09 NOTE — Progress Notes (Signed)
Pt. Moving clothes around in her room.  Pt. Refusing at first to go to have MRI done saying that she had to remain here and wait on her daughter who will be coming tomorrow.  Showed writer a picture of grandchildren.  Pt. Told that she would not stay at the hospital but would return.  Pt. Left with staff to have MRI done with encouragement.  Pt. Denies SI/HI.

## 2011-05-09 NOTE — Progress Notes (Signed)
[facility] Group Notes:  (Counselor/Nursing/MHT/Case Management/Adjunct)  05/09/2011 8:21 AM  Type of Therapy:  Group therapy  Participation Level:  Active  Participation Quality:  Intrusive, Redirectable Affect:  Appropriate  Cognitive:  Confused  Insight:  None  Engagement in Group:  Good  Engagement in Therapy:  Good  Modes of Intervention:  Clarification, Orientation and Support  Summary of Progress/Problems: Patient was disorganized in thinking and rambled. Before group she had stated she did not have a daughter but in group she gave the address. Not clear if this is the address she was found or if this is where daughter lives. She also gave a telephone number. Patient is not reality oriented. She got off task easily with food, walking around the room, etc.   Terea Neubauer 05/09/2011, 8:21 AM

## 2011-05-09 NOTE — Progress Notes (Signed)
Patient ID: [name], female   DOB: 14-Mar-1956, 56 y.o.   MRN: [id number] Patient was pleasant and cooperative during the assessment. "I'm going to the group home with the white people". Informed the writer that she should be getting a brown check and a blue check. "I want to get some of the money". Support and encouragement was offered.

## 2011-05-10 ENCOUNTER — Emergency Department (HOSPITAL_COMMUNITY): Payer: Medicaid Other

## 2011-05-10 ENCOUNTER — Inpatient Hospital Stay (HOSPITAL_COMMUNITY)
Admission: AD | Admit: 2011-05-10 | Discharge: 2011-05-15 | Disposition: A | Discharge status: Home / Self Care | Payer: Medicaid Other | Source: Ambulatory Visit | Attending: Internal Medicine | Admitting: Internal Medicine

## 2011-05-10 ENCOUNTER — Encounter (HOSPITAL_COMMUNITY): Payer: Self-pay

## 2011-05-10 ENCOUNTER — Inpatient Hospital Stay (HOSPITAL_COMMUNITY): Admit: 2011-05-10 | Payer: Medicaid Other

## 2011-05-10 DIAGNOSIS — R112 Nausea with vomiting, unspecified: Secondary | ICD-10-CM | POA: Diagnosis present

## 2011-05-10 DIAGNOSIS — K863 Pseudocyst of pancreas: Secondary | ICD-10-CM | POA: Diagnosis present

## 2011-05-10 DIAGNOSIS — F79 Unspecified intellectual disabilities: Secondary | ICD-10-CM | POA: Diagnosis present

## 2011-05-10 DIAGNOSIS — K859 Acute pancreatitis without necrosis or infection, unspecified: Secondary | ICD-10-CM | POA: Diagnosis present

## 2011-05-10 DIAGNOSIS — R1013 Epigastric pain: Secondary | ICD-10-CM | POA: Diagnosis present

## 2011-05-10 DIAGNOSIS — F17200 Nicotine dependence, unspecified, uncomplicated: Secondary | ICD-10-CM | POA: Diagnosis present

## 2011-05-10 DIAGNOSIS — R197 Diarrhea, unspecified: Secondary | ICD-10-CM | POA: Diagnosis present

## 2011-05-10 LAB — COMPREHENSIVE METABOLIC PANEL
AST: 21 U/L (ref 0–37)
Albumin: 3.2 g/dL — ABNORMAL LOW (ref 3.5–5.2)
Alkaline Phosphatase: 59 U/L (ref 39–117)
CO2: 28 mEq/L (ref 19–32)
Chloride: 100 mEq/L (ref 96–112)
GFR calc non Af Amer: 54 mL/min — ABNORMAL LOW (ref >90)
Potassium: 4.1 mEq/L (ref 3.5–5.1)
Total Bilirubin: 0.2 mg/dL — ABNORMAL LOW (ref 0.3–1.2)

## 2011-05-10 LAB — LIPASE, BLOOD: Lipase: 151 U/L — ABNORMAL HIGH (ref 11–59)

## 2011-05-10 LAB — DIFFERENTIAL
Eosinophils Absolute: 0.1 10*3/uL (ref 0.0–0.7)
Eosinophils Relative: 2 % (ref 0–5)
Lymphocytes Relative: 36 % (ref 12–46)
Lymphs Abs: 1.7 10*3/uL (ref 0.7–4.0)
Monocytes Relative: 13 % — ABNORMAL HIGH (ref 3–12)

## 2011-05-10 LAB — CBC
Hemoglobin: 12.5 g/dL (ref 12.0–15.0)
MCH: 29.9 pg (ref 26.0–34.0)
MCV: 87.3 fL (ref 78.0–100.0)
Platelets: 176 10*3/uL (ref 150–400)
RBC: 4.18 MIL/uL (ref 3.87–5.11)
WBC: 4.7 10*3/uL (ref 4.0–10.5)

## 2011-05-10 LAB — CARDIAC PANEL(CRET KIN+CKTOT+MB+TROPI)
CK, MB: 3 ng/mL (ref 0.3–4.0)
Relative Index: 2.3 (ref 0.0–2.5)
Troponin I: <0.3 ng/mL (ref <0.30)
Troponin I: <0.3 ng/mL (ref <0.30)

## 2011-05-10 LAB — GLUCOSE, CAPILLARY
Glucose-Capillary: 92 mg/dL (ref 70–99)
Glucose-Capillary: 77 mg/dL (ref 70–99)
Glucose-Capillary: 114 mg/dL — ABNORMAL HIGH (ref 70–99)

## 2011-05-10 LAB — URINALYSIS, ROUTINE W REFLEX MICROSCOPIC
Glucose, UA: NEGATIVE mg/dL
Hgb urine dipstick: NEGATIVE
Ketones, ur: NEGATIVE mg/dL
Leukocytes, UA: NEGATIVE
pH: 6.5 (ref 5.0–8.0)

## 2011-05-10 MED ORDER — ENOXAPARIN SODIUM 40 MG/0.4ML ~~LOC~~ SOLN
40.0000 mg | SUBCUTANEOUS | Status: DC
  Administered 2011-05-10 – 2011-05-14 (×4): 40 mg via SUBCUTANEOUS
  Filled 2011-05-10 (×6): qty 0.4

## 2011-05-10 MED ORDER — PANTOPRAZOLE SODIUM 40 MG PO TBEC
40.0000 mg | DELAYED_RELEASE_TABLET | Freq: Every day | ORAL | Status: DC
  Administered 2011-05-10 – 2011-05-15 (×6): 40 mg via ORAL
  Filled 2011-05-10 (×6): qty 1

## 2011-05-10 MED ORDER — CLONAZEPAM 0.5 MG PO TABS
0.5000 mg | ORAL_TABLET | ORAL | Status: DC
  Administered 2011-05-10 – 2011-05-15 (×14): 0.5 mg via ORAL
  Filled 2011-05-10 (×14): qty 1

## 2011-05-10 MED ORDER — IPRATROPIUM BROMIDE 0.02 % IN SOLN: 0.5000 mg | Freq: Four times a day (QID) | RESPIRATORY_TRACT | Status: DC

## 2011-05-10 MED ORDER — ONDANSETRON HCL 4 MG/2ML IJ SOLN
4.0000 mg | Freq: Four times a day (QID) | INTRAMUSCULAR | Status: DC | PRN
  Administered 2011-05-11: 4 mg via INTRAVENOUS
  Filled 2011-05-10: qty 2

## 2011-05-10 MED ORDER — INSULIN ASPART 100 UNIT/ML ~~LOC~~ SOLN
0.0000 [IU] | Freq: Three times a day (TID) | SUBCUTANEOUS | Status: DC
  Administered 2011-05-11 (×2): 2 [IU] via SUBCUTANEOUS
  Administered 2011-05-12 (×2): 1 [IU] via SUBCUTANEOUS
  Administered 2011-05-12 – 2011-05-13 (×2): 2 [IU] via SUBCUTANEOUS
  Administered 2011-05-14 (×3): 1 [IU] via SUBCUTANEOUS
  Administered 2011-05-15: 2 [IU] via SUBCUTANEOUS
  Administered 2011-05-15: 1 [IU] via SUBCUTANEOUS
  Filled 2011-05-10: qty 3

## 2011-05-10 MED ORDER — ONDANSETRON HCL 4 MG/2ML IJ SOLN
4.0000 mg | Freq: Once | INTRAMUSCULAR | Status: AC
  Administered 2011-05-10: 4 mg via INTRAVENOUS
  Filled 2011-05-10: qty 2

## 2011-05-10 MED ORDER — ONDANSETRON HCL 4 MG PO TABS: 4.0000 mg | ORAL_TABLET | Freq: Four times a day (QID) | ORAL | Status: DC | PRN

## 2011-05-10 MED ORDER — GABAPENTIN 100 MG PO CAPS
200.0000 mg | ORAL_CAPSULE | ORAL | Status: DC
  Administered 2011-05-10 – 2011-05-15 (×14): 200 mg via ORAL
  Filled 2011-05-10 (×18): qty 2

## 2011-05-10 MED ORDER — FENTANYL CITRATE 0.05 MG/ML IJ SOLN
50.0000 ug | Freq: Once | INTRAMUSCULAR | Status: AC
  Administered 2011-05-10: 50 ug via INTRAVENOUS
  Filled 2011-05-10: qty 2

## 2011-05-10 MED ORDER — ALBUTEROL SULFATE (5 MG/ML) 0.5% IN NEBU: 2.5000 mg | INHALATION_SOLUTION | Freq: Four times a day (QID) | RESPIRATORY_TRACT | Status: DC

## 2011-05-10 MED ORDER — IOHEXOL 300 MG/ML  SOLN
100.0000 mL | Freq: Once | INTRAMUSCULAR | Status: AC | PRN
  Administered 2011-05-10: 100 mL via INTRAVENOUS

## 2011-05-10 MED ORDER — SODIUM CHLORIDE 0.9 % IV BOLUS (SEPSIS)
1000.0000 mL | Freq: Once | INTRAVENOUS | Status: AC
  Administered 2011-05-10: 1000 mL via INTRAVENOUS

## 2011-05-10 MED ORDER — MORPHINE SULFATE 2 MG/ML IJ SOLN: 1.0000 mg | INTRAMUSCULAR | Status: DC | PRN

## 2011-05-10 MED ORDER — SODIUM CHLORIDE 0.9 % IV SOLN
INTRAVENOUS | Status: DC
Start: 2011-05-10 — End: 2011-05-12
  Administered 2011-05-10 – 2011-05-11 (×2): via INTRAVENOUS

## 2011-05-10 NOTE — ED Provider Notes (Signed)
History     CSN: [id number]  Arrival date & time 05/10/11  0500   First MD Initiated Contact with Patient 05/10/11 [phone]      Chief Complaint  Patient presents with  . Abdominal Pain    sinus congestion/diarrhea    (Consider location/radiation/quality/duration/timing/severity/associated sxs/prior treatment) HPI  Patient who was an inpatient at behavioral health they sent to emergency room by staff for acute onset nausea vomiting and diarrhea that began at 3 AM this morning with multiple episodes of vomiting and diarrhea since onset. Patient is a poor historian given her psychotic nature. Patient is lying in bed saying "I just feel sick I just feel sick." Staff denies any recorded fevers. Symptoms were acute onset, persistent, and unchanging. Since arrival to the ER patient has not had any more vomiting but has continued to have multiple episodes of loose stool. Patient denies any aggravating or alleviating factors.  Level 5 caveat for poor historian due to psychiatric hx/psychosis. Past Medical History  Diagnosis Date  . Diabetes mellitus   . Bipolar 1 disorder   . Schizoaffective disorder, bipolar type   . Hypertension     Past Surgical History  Procedure Date  . Arm surgery     No family history on file.  History  Substance Use Topics  . Smoking status: Current Everyday Smoker -- 1.0 packs/day for 5 years    Types: Cigarettes  . Smokeless tobacco: Never Used  . Alcohol Use: 0.0 oz/week    2-5 Glasses of wine per week     unknown/pt confused    OB History    Grav Para Term Preterm Abortions TAB SAB Ect Mult Living                  Review of Systems  All other systems reviewed and are negative.    Allergies  Review of patient's allergies indicates no known allergies.  Home Medications   Current Outpatient Rx  Name Route Sig Dispense Refill  . ARIPIPRAZOLE 5 MG PO TABS Oral Take 5 mg by mouth 2 (two) times daily.      Marland Kitchen LORAZEPAM 1 MG PO TABS Oral Take 1  mg by mouth every 8 (eight) hours.      . TRAZODONE HCL 150 MG PO TABS Oral Take 150 mg by mouth at bedtime.       BP 142/61  Pulse 83  Temp(Src) 98 F (36.7 C) (Oral)  Resp 20  Ht 5\' 1"  (1.549 m)  Wt 144 lb (65.318 kg)  BMI 27.21 kg/m2  SpO2 96%  LMP 04/19/2011  Physical Exam  Nursing note and vitals reviewed. Constitutional: She is oriented to person, place, and time. She appears well-developed and well-nourished. No distress.  HENT:  Head: Normocephalic and atraumatic.  Eyes: Conjunctivae and EOM are normal. Pupils are equal, round, and reactive to light.  Neck: Normal range of motion. Neck supple.  Cardiovascular: Normal rate, regular rhythm, normal heart sounds and intact distal pulses.  Exam reveals no gallop and no friction rub.   No murmur heard. Pulmonary/Chest: Effort normal and breath sounds normal. No respiratory distress. She has no wheezes. She has no rales. She exhibits no tenderness.  Abdominal: Bowel sounds are normal. She exhibits no distension, no ascites and no mass. There is tenderness in the suprapubic area. There is no rigidity, no rebound, no guarding and no CVA tenderness.  Musculoskeletal: Normal range of motion. She exhibits no edema and no tenderness.  Neurological: She is alert and  oriented to person, place, and time.  Skin: Skin is warm and dry. No rash noted. She is not diaphoretic. No erythema.    ED Course  Procedures (including critical care time)  IV fluids and IV Zofran.  Labs Reviewed  GLUCOSE, CAPILLARY - Abnormal; Notable for the following:    Glucose-Capillary 139 (*)    All other components within normal limits  COMPREHENSIVE METABOLIC PANEL - Abnormal; Notable for the following:    GFR calc non Af Amer 58 (*)    GFR calc Af Amer 67 (*)    All other components within normal limits  GLUCOSE, CAPILLARY - Abnormal; Notable for the following:    Glucose-Capillary 165 (*)    All other components within normal limits  GLUCOSE,  CAPILLARY - Abnormal; Notable for the following:    Glucose-Capillary 110 (*)    All other components within normal limits  GLUCOSE, CAPILLARY - Abnormal; Notable for the following:    Glucose-Capillary 121 (*)    All other components within normal limits  GLUCOSE, CAPILLARY - Abnormal; Notable for the following:    Glucose-Capillary 144 (*)    All other components within normal limits  GLUCOSE, CAPILLARY - Abnormal; Notable for the following:    Glucose-Capillary 121 (*)    All other components within normal limits  GLUCOSE, CAPILLARY - Abnormal; Notable for the following:    Glucose-Capillary 127 (*)    All other components within normal limits  GLUCOSE, CAPILLARY - Abnormal; Notable for the following:    Glucose-Capillary 135 (*)    All other components within normal limits  GLUCOSE, CAPILLARY - Abnormal; Notable for the following:    Glucose-Capillary 132 (*)    All other components within normal limits  GLUCOSE, CAPILLARY - Abnormal; Notable for the following:    Glucose-Capillary 136 (*)    All other components within normal limits  GLUCOSE, CAPILLARY - Abnormal; Notable for the following:    Glucose-Capillary 182 (*)    All other components within normal limits  DIFFERENTIAL - Abnormal; Notable for the following:    Monocytes Relative 13 (*)    All other components within normal limits  VITAMIN B12  FOLATE RBC  CBC  GLUCOSE, CAPILLARY  POCT CBG MONITORING  URINALYSIS, ROUTINE W REFLEX MICROSCOPIC  LIPASE, BLOOD  POCT CBG MONITORING  COMPREHENSIVE METABOLIC PANEL   Mr Brain Wo Contrast  05/10/2011  *RADIOLOGY REPORT*  Clinical Data: Confusion and long history of alcohol abuse.  MRI HEAD WITHOUT CONTRAST  Technique:  Multiplanar, multiecho pulse sequences of the brain and surrounding structures were obtained according to standard protocol without intravenous contrast.  Comparison: None.  Findings: Minimal periventricular white matter change may be within normal limits for  age.  No acute infarct, hemorrhage, or mass lesion is present.  The ventricles are of normal size.  No significant extra-axial fluid collection is present.  Note is made of hyperostosis frontalis internus.  Flow is present in the major intracranial arteries.  The globes and orbits are intact.  The paranasal sinuses and mastoid air cells are clear. No significant cerebral or cerebellar atrophy is evident.  IMPRESSION: Normal MRI of the brain for age.  Original Report Authenticated By: Jamesetta Orleans. MATTERN, M.D.   Ct Abdomen Pelvis W Contrast  05/10/2011  *RADIOLOGY REPORT*  Clinical Data: Nausea, vomiting, diarrhea, prior history of pancreatitis  CT ABDOMEN AND PELVIS WITH CONTRAST  Technique:  Multidetector CT imaging of the abdomen and pelvis was performed following the standard protocol during bolus  administration of intravenous contrast.  Contrast: OMNIPAQUE IOHEXOL 300 MG/ML IV SOLN  Comparison: Abdomen films of 05/10/2011  Findings: Linear atelectasis or scarring is noted at the lung bases.  The liver enhances with no focal abnormality and no ductal dilatation is seen.  The gallbladder is somewhat contracted and no calcified gallstones are noted.  The pancreas is unremarkable and the pancreatic duct is not dilated.  There is a small low attenuation structure near the tail of the pancreas which may represent a pseudocyst of 13 mm in diameter.  The adrenal glands and spleen are unremarkable.  The stomach is not distended.  The kidneys enhance with no definite abnormality.  There is low attenuation area in the upper pole of the left kidney which is indeterminate, better seen on coronal images and follow-up is recommended.  The abdominal aorta is normal in caliber.  No adenopathy is seen.  The uterus is normal in size.  No adnexal lesion is seen.  Only a tiny amount of free fluid is noted within the pelvis.  The urinary bladder is not well distended and is slightly thick-walled. Cystitis cannot be  excluded.  Diverticula scattered diffusely throughout the colon but no diverticulitis is seen.  The terminal ileum is unremarkable.  No bony abnormality is seen.  IMPRESSION:  1.  No evidence of acute pancreatitis. 2.  Small low attenuation structure near the tail of the pancreas may represent a 13 mm pseudocyst. 3.  Small indeterminate lesion in the upper pole of the left kidney.  Consider follow-up. 4.  Multiple colonic diverticula diffusely. 5.  Somewhat thick-walled urinary bladder.  Cannot exclude cystitis.  Original Report Authenticated By: Juline Patch, M.D.   Dg Abd Acute W/chest  05/10/2011  *RADIOLOGY REPORT*  Clinical Data: Upper abdominal pain with nausea and vomiting.  ACUTE ABDOMEN SERIES (ABDOMEN 2 VIEW & CHEST 1 VIEW)  Comparison:  None.  Findings:  There is no evidence of dilated bowel loops or free intraperitoneal air.  No radiopaque calculi or other significant radiographic abnormality is seen. Heart size and mediastinal contours are within normal limits.  Both lungs are clear.  There is mild peribronchial thickening which could suggest chronic bronchitis.  There is no effusion or pneumothorax.  IMPRESSION: Negative abdominal radiographs.  No acute cardiopulmonary disease. Question chronic bronchitis.  Original Report Authenticated By: Elsie Stain, M.D.     1. Schizoaffective disorder, bipolar type   2. Pancreatitis   3. Nausea vomiting and diarrhea       MDM  Vital signs stable, afebrile. [facility] team #3 to admit patient for nausea vomiting diarrhea and then assumed new diagnosed pancreatitis.        Jenness Corner, [place] 05/10/11 1209

## 2011-05-10 NOTE — ED Notes (Signed)
Pt presents from [facility] per security and tech-for evaluation of abd pain/diarrhea and sinus congestion

## 2011-05-10 NOTE — ED Notes (Signed)
CBG reported to Maryln Manuel MD (admitting MD).  Clear liquid diet ordered.  Pt given apple juice.  A&Ox 3 and calm and cooperative.  Pt's sitter [facility] to remain w/her.

## 2011-05-10 NOTE — H&P (Signed)
History and Physical Examination  Date: 05/10/2011  Patient name: [name] Medical record number: [id number] Date of birth: 09/18/1955 Age: 56 y.o. Gender: female PCP: Franchot Gallo, MD - psychiatrist  Attending physician: Hillery Aldo  Chief Complaint  Patient presents with  . Abdominal Pain    sinus congestion/diarrhea     History of Present Illness: [name] is an 56 y.o. female with bipolar type schizoaffective disorder [facility] when overnight she began to develop severe nausea vomiting and diarrhea.  She was sent to the emergency department for evaluation.  She was found to have an elevated lipase level and a CT scan was done that revealed a small pancreatic common pseudocyst.  She did not have acute pancreatitis according to the CT scan.  She also has been complaining of mild abdominal pain in the epigastric area and in combination with the nausea and vomiting and diarrhea a hospital admission was requested for further evaluation.  The patient denies having chest pain fever she is having some significant wheezing.  She does have a long history of tobacco smoking.  She denies headache visual changes rash and reports some no changes in recent medications.  She reports that her last on the supper before becoming ill was spaghetti with meatballs.  No known sick contacts but the patient had been institutionalized prior to this admission.  Past Medical History Past Medical History  Diagnosis Date  . Diabetes mellitus   . Bipolar 1 disorder   . Schizoaffective disorder, bipolar type   . Hypertension     Past Surgical History Past Surgical History  Procedure Date  . Arm surgery     Home Meds: Prior to Admission medications   Medication Sig Start Date End Date Taking? Authorizing Provider  ARIPiprazole (ABILIFY) 5 MG tablet Take 5 mg by mouth 2 (two) times daily.      Historical Provider, MD  LORazepam (ATIVAN) 1 MG  tablet Take 1 mg by mouth every 8 (eight) hours.      Historical Provider, MD  traZODone (DESYREL) 150 MG tablet Take 150 mg by mouth at bedtime.     Historical Provider, MD    Allergies: Review of patient's allergies indicates no known allergies.  Social History:  History   Social History  . Marital Status: Married    Spouse Name: N/A    Number of Children: N/A  . Years of Education: N/A   Occupational History  . Not on file.   Social History Main Topics  . Smoking status: Current Everyday Smoker -- 1.0 packs/day for 5 years    Types: Cigarettes  . Smokeless tobacco: Never Used  . Alcohol Use: 0.0 oz/week    2-5 Glasses of wine per week     unknown/pt confused  . Drug Use: Yes    Special: Marijuana  . Sexually Active: No   Other Topics Concern  . Not on file   Social History Narrative  . No narrative on file   Family History: History reviewed. No pertinent family history.  Review of Systems: Pertinent items are noted in HPI. All other systems reviewed and reported as negative.   Physical Exam: Blood pressure 104/68, pulse 70, temperature 98 F (36.7 C), temperature source Oral, resp. rate 20, height 5\' 1"  (1.549 m), weight 65.318 kg (144 lb), last menstrual period 04/19/2011, SpO2 97.00%. General appearance: alert, cooperative, appears older than stated age and no distress Head: Normocephalic, without obvious abnormality, atraumatic Eyes:  negative Nose: dried mucous seen in nares Throat: dry mucous membranes Neck: no adenopathy, no carotid bruit, no JVD, supple, symmetrical, trachea midline and thyroid not enlarged, symmetric, no tenderness/mass/nodules Lungs: bilateral insp/expiratory wheezes Heart: S1, S2 normal Abdomen: soft, no masses, very mild epigastric tenderness with deep palpation, no guarding, no rebound tenderness, bowel sounds present Extremities: extremities normal, atraumatic, no cyanosis or edema Skin: Skin color, texture, turgor normal. No  rashes or lesions Neurologic: Grossly normal  Lab  And Imaging results:  Results for orders placed during the hospital encounter of 05/07/11 (from the past 24 hour(s))  GLUCOSE, CAPILLARY     Status: Abnormal   Collection Time   05/09/11  4:57 PM      Component Value Range   Glucose-Capillary 136 (*) 70 - 99 (mg/dL)  GLUCOSE, CAPILLARY     Status: Abnormal   Collection Time   05/09/11  9:30 PM      Component Value Range   Glucose-Capillary 182 (*) 70 - 99 (mg/dL)  GLUCOSE, CAPILLARY     Status: Normal   Collection Time   05/10/11  7:13 AM      Component Value Range   Glucose-Capillary 92  70 - 99 (mg/dL)   Comment 1 Documented in Chart     Comment 2 Notify RN    CBC     Status: Normal   Collection Time   05/10/11  7:19 AM      Component Value Range   WBC 4.7  4.0 - 10.5 (K/uL)   RBC 4.18  3.87 - 5.11 (MIL/uL)   Hemoglobin 12.5  12.0 - 15.0 (g/dL)   HCT 96.0  45.4 - 09.8 (%)   MCV 87.3  78.0 - 100.0 (fL)   MCH 29.9  26.0 - 34.0 (pg)   MCHC 34.2  30.0 - 36.0 (g/dL)   RDW 11.9  14.7 - 82.9 (%)   Platelets 176  150 - 400 (K/uL)  DIFFERENTIAL     Status: Abnormal   Collection Time   05/10/11  7:19 AM      Component Value Range   Neutrophils Relative 49  43 - 77 (%)   Neutro Abs 2.3  1.7 - 7.7 (K/uL)   Lymphocytes Relative 36  12 - 46 (%)   Lymphs Abs 1.7  0.7 - 4.0 (K/uL)   Monocytes Relative 13 (*) 3 - 12 (%)   Monocytes Absolute 0.6  0.1 - 1.0 (K/uL)   Eosinophils Relative 2  0 - 5 (%)   Eosinophils Absolute 0.1  0.0 - 0.7 (K/uL)   Basophils Relative 0  0 - 1 (%)   Basophils Absolute 0.0  0.0 - 0.1 (K/uL)  LIPASE, BLOOD     Status: Abnormal   Collection Time   05/10/11  7:19 AM      Component Value Range   Lipase 151 (*) 11 - 59 (U/L)  COMPREHENSIVE METABOLIC PANEL     Status: Abnormal   Collection Time   05/10/11  7:19 AM      Component Value Range   Sodium 133 (*) 135 - 145 (mEq/L)   Potassium 4.1  3.5 - 5.1 (mEq/L)   Chloride 100  96 - 112 (mEq/L)   CO2 28  19 - 32  (mEq/L)   Glucose, Bld 104 (*) 70 - 99 (mg/dL)   BUN 10  6 - 23 (mg/dL)   Creatinine, Ser 5.62 (*) 0.50 - 1.10 (mg/dL)   Calcium 9.8  8.4 - 13.0 (mg/dL)  Total Protein 6.2  6.0 - 8.3 (g/dL)   Albumin 3.2 (*) 3.5 - 5.2 (g/dL)   AST 21  0 - 37 (U/L)   ALT 22  0 - 35 (U/L)   Alkaline Phosphatase 59  39 - 117 (U/L)   Total Bilirubin 0.2 (*) 0.3 - 1.2 (mg/dL)   GFR calc non Af Amer 54 (*) >90 (mL/min)   GFR calc Af Amer 63 (*) >90 (mL/min)  URINALYSIS, ROUTINE W REFLEX MICROSCOPIC     Status: Normal   Collection Time   05/10/11  7:37 AM      Component Value Range   Color, Urine YELLOW  YELLOW    APPearance CLEAR  CLEAR    Specific Gravity, Urine 1.005  1.005 - 1.030    pH 6.5  5.0 - 8.0    Glucose, UA NEGATIVE  NEGATIVE (mg/dL)   Hgb urine dipstick NEGATIVE  NEGATIVE    Bilirubin Urine NEGATIVE  NEGATIVE    Ketones, ur NEGATIVE  NEGATIVE (mg/dL)   Protein, ur NEGATIVE  NEGATIVE (mg/dL)   Urobilinogen, UA 0.2  0.0 - 1.0 (mg/dL)   Nitrite NEGATIVE  NEGATIVE    Leukocytes, UA NEGATIVE  NEGATIVE    EKG Results:  No orders found for this or any previous visit.   Impression  Principal Problem:  *Substance abuse Active Problems:  Schizoaffective disorder, bipolar type  Cannabis abuse  Alcohol dependence  Pancreatitis  Active smoker  Pseudocyst, pancreas  Nausea vomiting and diarrhea  Abdominal pain, acute, epigastric  Mental retardation likely gastroenteritis   Plan  Admit to inpatient for IVFs, pain and nausea medicines as needed, Pt does not appear to have acute pancreatitis and very benign abdominal exam, will try clear liquids, likely this is a viral gastroenteritis, follow lipase, i'm ordering stat neb treatments now, given patient's excessive wheezing, Please see orders.  Resume psychiatric medications.    Standley Dakins MD [facility] [place], [place] [phone] 05/10/2011, 12:41 PM

## 2011-05-10 NOTE — ED Notes (Signed)
Patient is resting comfortably. 

## 2011-05-10 NOTE — Progress Notes (Signed)
Has slept very little this shift.  Now having diarrhea and vomiting without nausea.  Vomitus appears to consist of undigested food particles.  Diarrhea stools are large amounts of light brown liquid.. Secretions from nasopharynx are thick and yellow in color.  Also C/O abdominal discomfort.  The patient is confused and disoriented as to person and location but manageable  and cooperative.  Verne Spurr, PA notified and gave order to transfer patient to [facility] for evaluation of vomiting and diarrhes.  ED Charge nurse, Jay Schlichter notified and accepted the referral . Pt. Left unit at 04:45 accompanied by Security and [facility] for transport to [facility]

## 2011-05-10 NOTE — ED Notes (Signed)
[id number]> Expected date:<BR> Expected time:<BR> Means of arrival:<BR> Comments:<BR> [facility] from [facility]

## 2011-05-10 NOTE — Progress Notes (Signed)
Utilization review done to request additional days for psychiatric stabilization, and placement into facility.  Awaiting results.  Ambrose Mantle, LCSW 05/10/2011, 11:30 AM

## 2011-05-11 DIAGNOSIS — F259 Schizoaffective disorder, unspecified: Secondary | ICD-10-CM

## 2011-05-11 DIAGNOSIS — R748 Abnormal levels of other serum enzymes: Secondary | ICD-10-CM | POA: Diagnosis present

## 2011-05-11 LAB — COMPREHENSIVE METABOLIC PANEL
ALT: 20 U/L (ref 0–35)
AST: 16 U/L (ref 0–37)
Albumin: 2.8 g/dL — ABNORMAL LOW (ref 3.5–5.2)
Alkaline Phosphatase: 56 U/L (ref 39–117)
CO2: 28 mEq/L (ref 19–32)
Chloride: 102 mEq/L (ref 96–112)
Potassium: 4 mEq/L (ref 3.5–5.1)
Total Bilirubin: 0.3 mg/dL (ref 0.3–1.2)

## 2011-05-11 LAB — GLUCOSE, CAPILLARY
Glucose-Capillary: 149 mg/dL — ABNORMAL HIGH (ref 70–99)
Glucose-Capillary: 90 mg/dL (ref 70–99)
Glucose-Capillary: 140 mg/dL — ABNORMAL HIGH (ref 70–99)

## 2011-05-11 LAB — CBC
MCH: 30.7 pg (ref 26.0–34.0)
MCHC: 35 g/dL (ref 30.0–36.0)
MCV: 87.8 fL (ref 78.0–100.0)
Platelets: 169 10*3/uL (ref 150–400)
RDW: 13.3 % (ref 11.5–15.5)

## 2011-05-11 MED ORDER — ALBUTEROL SULFATE (5 MG/ML) 0.5% IN NEBU
2.5000 mg | INHALATION_SOLUTION | RESPIRATORY_TRACT | Status: DC | PRN
  Administered 2011-05-12 – 2011-05-14 (×2): 2.5 mg via RESPIRATORY_TRACT
  Filled 2011-05-11 (×2): qty 0.5

## 2011-05-11 NOTE — Progress Notes (Signed)
Patient came from behavioral health after developing nausea and vomiting. Initial evaluation states that patient was wheezing. Upon assessment this evening the patient was clear and slightly diminished and not complaining of shortness of breath, not on any oxygen and in no distress, RT will change nebulizer treatments to PRN.

## 2011-05-11 NOTE — Progress Notes (Signed)
PATIENT DETAILS Name: [name] Age: 55 y.o. Sex: female Date of Birth: 1956/05/06 Admit Date: 05/07/2011 PCP:No primary provider on file. POA:   CONSULTS: None  Interval History: [name] is a 56 year old female who was admitted to [facility] on 05/07/11 for treatment of polysubstance abuse and bipolar / schizoaffective disorder.  While there, she developed abdominal pain, diarrhea and vomiting on 05/10/11, and was brought to the ED for further evaluation where she was found to have an elevated lipase.    ROS: The patient denies any further nausea, vomiting, or diarrhea.  She had a formed stool today.  Wants to eat solid foods.   Objective: Vital signs in last 24 hours: Temp:  [97.6 F (36.4 C)-98.4 F (36.9 C)] 97.6 F (36.4 C) (01/05 1410) Pulse Rate:  [70-101] 76  (01/05 1410) Resp:  [18-20] 18  (01/05 1410) BP: (139-172)/(71-93) 164/93 mmHg (01/05 1410) SpO2:  [94 %-100 %] 96 % (01/05 1410) Weight change:  Last BM Date: 05/11/11  Intake/Output from previous day:  Intake/Output Summary (Last 24 hours) at 05/11/11 1512 Last data filed at 05/11/11 1400  Gross per 24 hour  Intake 4293.35 ml  Output      0 ml  Net 4293.35 ml     Physical Exam:  Gen:  NAD Cardiovascular:  RRR, No M/R/G Respiratory: Lungs CTAB Gastrointestinal: Abdomen soft, NT/ND with normal active bowel sounds. Extremities: No C/E/C     Lab Results: Basic Metabolic Panel:  Lab 05/11/11 1191 05/10/11 0719 05/07/11 1954 05/06/11 0630  NA 135 133* 135 137  K 4.0 4.1 -- --  CL 102 100 98 99  CO2 28 28 30 28   GLUCOSE 133* 104* 92 115*  BUN 7 10 13 8   CREATININE 1.05 1.11* 1.05 0.91  CALCIUM 9.1 9.8 9.9 10.1  MG -- -- -- --  PHOS -- -- -- --   GFR Estimated Creatinine Clearance: 51.8 ml/min (by C-G formula based on Cr of 1.05). Liver Function Tests:  Lab 05/11/11 0620 05/10/11 0719 05/07/11 1954 05/06/11 0630  AST 16 21 25 27   ALT 20 22 21 25   ALKPHOS 56 59 70 80  BILITOT 0.3 0.2* 0.3  0.6  PROT 5.7* 6.2 6.4 7.5  ALBUMIN 2.8* 3.2* 3.5 4.0    Lab 05/11/11 0620 05/10/11 0719  LIPASE 65* 151*  AMYLASE -- --    CBC:  Lab 05/11/11 0620 05/10/11 0719 05/06/11 0630  WBC 3.5* 4.7 7.2  NEUTROABS -- 2.3 3.4  HGB 12.1 12.5 15.4*  HCT 34.6* 36.5 42.3  MCV 87.8 87.3 83.9  PLT 169 176 254   Cardiac Enzymes:  Lab 05/10/11 2125 05/10/11 1351  CKTOTAL 128 216*  CKMB 3.0 3.8  CKMBINDEX -- --  TROPONINI <0.30 <0.30   CBG:  Lab 05/11/11 1131 05/11/11 0749 05/10/11 2247 05/10/11 1725 05/10/11 0713  GLUCAP 149* 188* 114* 77 92   Hgb A1c  Basename 05/10/11 0719  HGBA1C 6.4*  Results for [name] (MRN [id number]) as of 05/11/2011 15:26  Ref. Range 05/10/2011 07:37  Color, Urine Latest Range: YELLOW  YELLOW  APPearance Latest Range: CLEAR  CLEAR  Specific Gravity, Urine Latest Range: 1.005-1.030  1.005  pH Latest Range: 5.0-8.0  6.5  Glucose, UA Latest Range: NEGATIVE mg/dL NEGATIVE  Bilirubin Urine Latest Range: NEGATIVE  NEGATIVE  Ketones, ur Latest Range: NEGATIVE mg/dL NEGATIVE  Protein Latest Range: NEGATIVE mg/dL NEGATIVE  Urobilinogen, UA Latest Range: 0.0-1.0 mg/dL 0.2  Nitrite Latest Range: NEGATIVE  NEGATIVE  Leukocytes, UA Latest Range: NEGATIVE  NEGATIVE   Studies/Results: Mr Brain Wo Contrast  05/10/2011   IMPRESSION: Normal MRI of the brain for age.  Original Report Authenticated By: Jamesetta Orleans. MATTERN, M.D.   Ct Abdomen Pelvis W Contrast  05/10/2011 IMPRESSION:  1.  No evidence of acute pancreatitis. 2.  Small low attenuation structure near the tail of the pancreas may represent a 13 mm pseudocyst. 3.  Small indeterminate lesion in the upper pole of the left kidney.  Consider follow-up. 4.  Multiple colonic diverticula diffusely. 5.  Somewhat thick-walled urinary bladder.  Cannot exclude cystitis.  Original Report Authenticated By: Juline Patch, M.D.   Dg Abd Acute W/chest  05/10/2011  IMPRESSION: Negative abdominal radiographs.  No acute  cardiopulmonary disease. Question chronic bronchitis.  Original Report Authenticated By: Elsie Stain, M.D.    Medications: Scheduled Meds:    . ARIPiprazole  5 mg Oral BH-qamhs  . clonazePAM  0.5 mg Oral BH-q8a2phs  . enoxaparin  40 mg Subcutaneous Q24H  . gabapentin  200 mg Oral BH-q8a2phs  . insulin aspart  0-9 Units Subcutaneous TID WC  . lisinopril  10 mg Oral Daily  . pantoprazole  40 mg Oral Q1200  . traZODone  150 mg Oral QHS  . DISCONTD: albuterol  2.5 mg Nebulization Q6H  . DISCONTD: clonazePAM  0.5 mg Oral BH-q8a2phs  . DISCONTD: gabapentin  200 mg Oral BH-q8a2phs  . DISCONTD: ipratropium  0.5 mg Nebulization Q6H  . DISCONTD: metFORMIN  500 mg Oral BID WC  . DISCONTD: mulitivitamin with minerals  1 tablet Oral Daily  . DISCONTD: thiamine  100 mg Oral Daily   Continuous Infusions:    . sodium chloride 75 mL/hr at 05/11/11 1012   PRN Meds:.acetaminophen, albuterol, magnesium hydroxide, morphine, ondansetron (ZOFRAN) IV, ondansetron, DISCONTD: alum & mag hydroxide-simeth Antibiotics: Anti-infectives    None       Assessment/Plan:  Principal Problem:  *Nausea vomiting, diarrhea and abdominal pain secondary to pancreatitis/ serum lipase elevation / pancreatic pseudocyst. The patient was admitted and put on a CL diet.  PRN pain and nausea medications were provided. Unclear if symptoms are from pancreatitis or viral gastroenteritis.  Lipase has come down.  Will advance diet and monitor. Active Problems:  Schizoaffective disorder, bipolar type Continue psychotropic medications.  Transfer back to [facility] when medically stable.  Cannabis abuse / Alcohol dependence / Substance abuse Transfer back to [facility] for further treatment, when medically stable.   Active smoker Tobacco cessation counseling per nursing staff ordered.  Mental retardation Likely to d/c to a group home/ALF.     LOS: 4 days   Alison Aldo, MD Pager [phone]  05/11/2011, 3:12 PM

## 2011-05-11 NOTE — Consult Note (Signed)
Reason for Consult:Psychiatric medication management Referring Physician: Dr. Laural Benes  [id number]  [name] is an 56 y.o. female.  HPI: Pt was transferred from [facility] with complaint of stomach pain to ED to 1510  Past Medical History  Diagnosis Date  . Diabetes mellitus   . Bipolar 1 disorder   . Schizoaffective disorder, bipolar type   . Hypertension     Past Surgical History  Procedure Date  . Arm surgery     History reviewed. No pertinent family history.  Social History:  reports that she has been smoking Cigarettes.  She has a 5 pack-year smoking history. She has never used smokeless tobacco. She reports that she drinks alcohol. She reports that she uses illicit drugs (Marijuana).  Allergies: No Known Allergies  Medications: I have reviewed the patient's current medications.  Results for orders placed during the hospital encounter of 05/07/11 (from the past 48 hour(s))  GLUCOSE, CAPILLARY     Status: Abnormal   Collection Time   05/09/11  4:57 PM      Component Value Range Comment   Glucose-Capillary 136 (*) 70 - 99 (mg/dL)   GLUCOSE, CAPILLARY     Status: Abnormal   Collection Time   05/09/11  9:30 PM      Component Value Range Comment   Glucose-Capillary 182 (*) 70 - 99 (mg/dL)   GLUCOSE, CAPILLARY     Status: Normal   Collection Time   05/10/11  7:13 AM      Component Value Range Comment   Glucose-Capillary 92  70 - 99 (mg/dL)    Comment 1 Documented in Chart      Comment 2 Notify RN     CBC     Status: Normal   Collection Time   05/10/11  7:19 AM      Component Value Range Comment   WBC 4.7  4.0 - 10.5 (K/uL)    RBC 4.18  3.87 - 5.11 (MIL/uL)    Hemoglobin 12.5  12.0 - 15.0 (g/dL)    HCT 45.4  09.8 - 11.9 (%)    MCV 87.3  78.0 - 100.0 (fL)    MCH 29.9  26.0 - 34.0 (pg)    MCHC 34.2  30.0 - 36.0 (g/dL)    RDW 14.7  82.9 - 56.2 (%)    Platelets 176  150 - 400 (K/uL)   DIFFERENTIAL     Status: Abnormal   Collection Time   05/10/11  7:19 AM      Component Value  Range Comment   Neutrophils Relative 49  43 - 77 (%)    Neutro Abs 2.3  1.7 - 7.7 (K/uL)    Lymphocytes Relative 36  12 - 46 (%)    Lymphs Abs 1.7  0.7 - 4.0 (K/uL)    Monocytes Relative 13 (*) 3 - 12 (%)    Monocytes Absolute 0.6  0.1 - 1.0 (K/uL)    Eosinophils Relative 2  0 - 5 (%)    Eosinophils Absolute 0.1  0.0 - 0.7 (K/uL)    Basophils Relative 0  0 - 1 (%)    Basophils Absolute 0.0  0.0 - 0.1 (K/uL)   LIPASE, BLOOD     Status: Abnormal   Collection Time   05/10/11  7:19 AM      Component Value Range Comment   Lipase 151 (*) 11 - 59 (U/L)   COMPREHENSIVE METABOLIC PANEL     Status: Abnormal   Collection Time   05/10/11  7:19 AM      Component Value Range Comment   Sodium 133 (*) 135 - 145 (mEq/L)    Potassium 4.1  3.5 - 5.1 (mEq/L)    Chloride 100  96 - 112 (mEq/L)    CO2 28  19 - 32 (mEq/L)    Glucose, Bld 104 (*) 70 - 99 (mg/dL)    BUN 10  6 - 23 (mg/dL)    Creatinine, Ser 1.30 (*) 0.50 - 1.10 (mg/dL)    Calcium 9.8  8.4 - 10.5 (mg/dL)    Total Protein 6.2  6.0 - 8.3 (g/dL)    Albumin 3.2 (*) 3.5 - 5.2 (g/dL)    AST 21  0 - 37 (U/L)    ALT 22  0 - 35 (U/L)    Alkaline Phosphatase 59  39 - 117 (U/L)    Total Bilirubin 0.2 (*) 0.3 - 1.2 (mg/dL)    GFR calc non Af Amer 54 (*) >90 (mL/min)    GFR calc Af Amer 63 (*) >90 (mL/min)   HEMOGLOBIN A1C     Status: Abnormal   Collection Time   05/10/11  7:19 AM      Component Value Range Comment   Hemoglobin A1C 6.4 (*) <5.7 (%)    Mean Plasma Glucose 137 (*) <117 (mg/dL)   URINALYSIS, ROUTINE W REFLEX MICROSCOPIC     Status: Normal   Collection Time   05/10/11  7:37 AM      Component Value Range Comment   Color, Urine YELLOW  YELLOW     APPearance CLEAR  CLEAR     Specific Gravity, Urine 1.005  1.005 - 1.030     pH 6.5  5.0 - 8.0     Glucose, UA NEGATIVE  NEGATIVE (mg/dL)    Hgb urine dipstick NEGATIVE  NEGATIVE     Bilirubin Urine NEGATIVE  NEGATIVE     Ketones, ur NEGATIVE  NEGATIVE (mg/dL)    Protein, ur NEGATIVE   NEGATIVE (mg/dL)    Urobilinogen, UA 0.2  0.0 - 1.0 (mg/dL)    Nitrite NEGATIVE  NEGATIVE     Leukocytes, UA NEGATIVE  NEGATIVE  MICROSCOPIC NOT DONE ON URINES WITH NEGATIVE PROTEIN, BLOOD, LEUKOCYTES, NITRITE, OR GLUCOSE <1000 mg/dL.  CARDIAC PANEL(CRET KIN+CKTOT+MB+TROPI)     Status: Abnormal   Collection Time   05/10/11  1:51 PM      Component Value Range Comment   Total CK 216 (*) 7 - 177 (U/L)    CK, MB 3.8  0.3 - 4.0 (ng/mL)    Troponin I <0.30  <0.30 (ng/mL)    Relative Index 1.8  0.0 - 2.5    GLUCOSE, CAPILLARY     Status: Normal   Collection Time   05/10/11  5:25 PM      Component Value Range Comment   Glucose-Capillary 77  70 - 99 (mg/dL)   CARDIAC PANEL(CRET KIN+CKTOT+MB+TROPI)     Status: Normal   Collection Time   05/10/11  9:25 PM      Component Value Range Comment   Total CK 128  7 - 177 (U/L)    CK, MB 3.0  0.3 - 4.0 (ng/mL)    Troponin I <0.30  <0.30 (ng/mL)    Relative Index 2.3  0.0 - 2.5    GLUCOSE, CAPILLARY     Status: Abnormal   Collection Time   05/10/11 10:47 PM      Component Value Range Comment   Glucose-Capillary 114 (*) 70 - 99 (  mg/dL)    Comment 1 Notify RN     COMPREHENSIVE METABOLIC PANEL     Status: Abnormal   Collection Time   05/11/11  6:20 AM      Component Value Range Comment   Sodium 135  135 - 145 (mEq/L)    Potassium 4.0  3.5 - 5.1 (mEq/L)    Chloride 102  96 - 112 (mEq/L)    CO2 28  19 - 32 (mEq/L)    Glucose, Bld 133 (*) 70 - 99 (mg/dL)    BUN 7  6 - 23 (mg/dL)    Creatinine, Ser 4.09  0.50 - 1.10 (mg/dL)    Calcium 9.1  8.4 - 10.5 (mg/dL)    Total Protein 5.7 (*) 6.0 - 8.3 (g/dL)    Albumin 2.8 (*) 3.5 - 5.2 (g/dL)    AST 16  0 - 37 (U/L)    ALT 20  0 - 35 (U/L)    Alkaline Phosphatase 56  39 - 117 (U/L)    Total Bilirubin 0.3  0.3 - 1.2 (mg/dL)    GFR calc non Af Amer 58 (*) >90 (mL/min)    GFR calc Af Amer 67 (*) >90 (mL/min)   CBC     Status: Abnormal   Collection Time   05/11/11  6:20 AM      Component Value Range Comment   WBC  3.5 (*) 4.0 - 10.5 (K/uL)    RBC 3.94  3.87 - 5.11 (MIL/uL)    Hemoglobin 12.1  12.0 - 15.0 (g/dL)    HCT 81.1 (*) 91.4 - 46.0 (%)    MCV 87.8  78.0 - 100.0 (fL)    MCH 30.7  26.0 - 34.0 (pg)    MCHC 35.0  30.0 - 36.0 (g/dL)    RDW 78.2  95.6 - 21.3 (%)    Platelets 169  150 - 400 (K/uL)   LIPASE, BLOOD     Status: Abnormal   Collection Time   05/11/11  6:20 AM      Component Value Range Comment   Lipase 65 (*) 11 - 59 (U/L)   GLUCOSE, CAPILLARY     Status: Abnormal   Collection Time   05/11/11  7:49 AM      Component Value Range Comment   Glucose-Capillary 188 (*) 70 - 99 (mg/dL)   GLUCOSE, CAPILLARY     Status: Abnormal   Collection Time   05/11/11 11:31 AM      Component Value Range Comment   Glucose-Capillary 149 (*) 70 - 99 (mg/dL)     Mr Brain Wo Contrast  05/10/2011  *RADIOLOGY REPORT*  Clinical Data: Confusion and long history of alcohol abuse.  MRI HEAD WITHOUT CONTRAST  Technique:  Multiplanar, multiecho pulse sequences of the brain and surrounding structures were obtained according to standard protocol without intravenous contrast.  Comparison: None.  Findings: Minimal periventricular white matter change may be within normal limits for age.  No acute infarct, hemorrhage, or mass lesion is present.  The ventricles are of normal size.  No significant extra-axial fluid collection is present.  Note is made of hyperostosis frontalis internus.  Flow is present in the major intracranial arteries.  The globes and orbits are intact.  The paranasal sinuses and mastoid air cells are clear. No significant cerebral or cerebellar atrophy is evident.  IMPRESSION: Normal MRI of the brain for age.  Original Report Authenticated By: Jamesetta Orleans. MATTERN, M.D.   Ct Abdomen Pelvis W Contrast  05/10/2011  *  RADIOLOGY REPORT*  Clinical Data: Nausea, vomiting, diarrhea, prior history of pancreatitis  CT ABDOMEN AND PELVIS WITH CONTRAST  Technique:  Multidetector CT imaging of the abdomen and pelvis was  performed following the standard protocol during bolus administration of intravenous contrast.  Contrast: OMNIPAQUE IOHEXOL 300 MG/ML IV SOLN  Comparison: Abdomen films of 05/10/2011  Findings: Linear atelectasis or scarring is noted at the lung bases.  The liver enhances with no focal abnormality and no ductal dilatation is seen.  The gallbladder is somewhat contracted and no calcified gallstones are noted.  The pancreas is unremarkable and the pancreatic duct is not dilated.  There is a small low attenuation structure near the tail of the pancreas which may represent a pseudocyst of 13 mm in diameter.  The adrenal glands and spleen are unremarkable.  The stomach is not distended.  The kidneys enhance with no definite abnormality.  There is low attenuation area in the upper pole of the left kidney which is indeterminate, better seen on coronal images and follow-up is recommended.  The abdominal aorta is normal in caliber.  No adenopathy is seen.  The uterus is normal in size.  No adnexal lesion is seen.  Only a tiny amount of free fluid is noted within the pelvis.  The urinary bladder is not well distended and is slightly thick-walled. Cystitis cannot be excluded.  Diverticula scattered diffusely throughout the colon but no diverticulitis is seen.  The terminal ileum is unremarkable.  No bony abnormality is seen.  IMPRESSION:  1.  No evidence of acute pancreatitis. 2.  Small low attenuation structure near the tail of the pancreas may represent a 13 mm pseudocyst. 3.  Small indeterminate lesion in the upper pole of the left kidney.  Consider follow-up. 4.  Multiple colonic diverticula diffusely. 5.  Somewhat thick-walled urinary bladder.  Cannot exclude cystitis.  Original Report Authenticated By: Juline Patch, M.D.   Dg Abd Acute W/chest  05/10/2011  *RADIOLOGY REPORT*  Clinical Data: Upper abdominal pain with nausea and vomiting.  ACUTE ABDOMEN SERIES (ABDOMEN 2 VIEW & CHEST 1 VIEW)  Comparison:  None.   Findings:  There is no evidence of dilated bowel loops or free intraperitoneal air.  No radiopaque calculi or other significant radiographic abnormality is seen. Heart size and mediastinal contours are within normal limits.  Both lungs are clear.  There is mild peribronchial thickening which could suggest chronic bronchitis.  There is no effusion or pneumothorax.  IMPRESSION: Negative abdominal radiographs.  No acute cardiopulmonary disease. Question chronic bronchitis.  Original Report Authenticated By: Elsie Stain, M.D.    Review of Systems  Unable to perform ROS: mental acuity   Blood pressure 164/93, pulse 76, temperature 97.6 F (36.4 C), temperature source Oral, resp. rate 18, height 5\' 1"  (1.549 m), weight 65.318 kg (144 lb), last menstrual period 04/19/2011, SpO2 96.00%. Physical Exam  Assessment/Plan: Patient is a 56 yo AAF who has been living with her daughter since May 2012.  She wanted to give her mother the opportunity to live in a home instead of the group home she had been in for the two previous hears.   She has a chronic history of polysubstance dependence and Schizoaffective Disorder.  Her behavior resulted in her daughter spending years in foster care until she was age 84.  Her daughter wanted to help her but found that she resorted to drinking alcohol daily and by the end of 2012 her behavior was out of control.  Her  daughter says that she only drank alcohol and smoked marijuana to her knowledge.  She would disappear from home for hours.  If she brought beer home, she was very erratic and unpredictable.  She was often at the edge of the road, panhandling, wearing clothes wrong side out, clogging up plumbing.  Her daughter tried to facilitate admission to rehabilitation treatment centers and her mother would deny drinking alcohol.  The service helping this patient is: [facility] [phone]; the worker was Toni Amend [phone].  She was brought to [facility] twice and returned  home until daughter had to call the police for her erratic behavior. She became a danger to herself when allowed to live in an restricted environment She was there when she complained of stomach pain.  Labs reported increase Lipase, which may occur following nausea and vomiting.  Today she is served a regular diet which she is observed eating with "gusto".  She says she enjoyed her food, She has a cheerful affect and responds appropriately. RECOMMENDATION:  1. Psychiatric medications as written 2. Consider transfer when medically stable to [facility] 3. At [facility], evaluate for psychosis, withdrawal symptoms, and acceptance of plan to admit to rehabilitation program or to supervised medications management in group home.  Her psychiatric needs and medical conditions indicate she needs a supportive environment to receive treatments that will minimize frequent return to hospital care. Sandy Haye 05/11/2011, 3:48 PM

## 2011-05-11 NOTE — ED Provider Notes (Signed)
Medical screening examination/treatment/procedure(s) were performed by non-physician practitioner and as supervising physician I was immediately available for consultation/collaboration.  Aloma Boch T Laylee Schooley, MD 05/11/11 0652 

## 2011-05-12 DIAGNOSIS — E119 Type 2 diabetes mellitus without complications: Secondary | ICD-10-CM | POA: Diagnosis present

## 2011-05-12 DIAGNOSIS — J029 Acute pharyngitis, unspecified: Secondary | ICD-10-CM | POA: Diagnosis present

## 2011-05-12 DIAGNOSIS — R42 Dizziness and giddiness: Secondary | ICD-10-CM | POA: Diagnosis present

## 2011-05-12 LAB — GLUCOSE, CAPILLARY: Glucose-Capillary: 140 mg/dL — ABNORMAL HIGH (ref 70–99)

## 2011-05-12 LAB — HEMOGLOBIN AND HEMATOCRIT, BLOOD: HCT: 35.9 % — ABNORMAL LOW (ref 36.0–46.0)

## 2011-05-12 MED ORDER — MENTHOL 3 MG MT LOZG
1.0000 | LOZENGE | OROMUCOSAL | Status: DC | PRN
  Filled 2011-05-12: qty 9

## 2011-05-12 MED ORDER — NICOTINE 7 MG/24HR TD PT24
7.0000 mg | MEDICATED_PATCH | Freq: Every day | TRANSDERMAL | Status: DC
  Administered 2011-05-12 – 2011-05-15 (×4): 7 mg via TRANSDERMAL
  Filled 2011-05-12 (×4): qty 1

## 2011-05-12 NOTE — Progress Notes (Signed)
CSW was notified by MD that patient ready to return to  [facility], unfortunately no beds are available for patient per Tom at [facility].  Possible bed tomorrow 1/7 for patient.    Will have weekday CSW to follow up.  Ashley Jacobs, MSW LCSW [phone]  [phone]

## 2011-05-12 NOTE — Progress Notes (Signed)
PATIENT DETAILS Name: [name] Age: 56 y.o. Sex: female Date of Birth: 1955-06-19 Admit Date: 05/07/2011 PCP:No primary provider on file. POA:   CONSULTS: None  Interval History: Ms. [name] is a 56 year old female who was admitted to [facility] on 05/07/11 for treatment of polysubstance abuse and bipolar / schizoaffective disorder.  While there, she developed abdominal pain, diarrhea and vomiting on 05/10/11, and was brought to the ED for further evaluation where she was found to have an elevated lipase.  The patient's symptoms resolved with conservative therapy, and she is medically stable for transfer back to [facility].  ROS: The patient denies any further nausea, vomiting, or diarrhea.  Appetite is good.  Stated that she had an episode of bleeding after her blood was taken, and that now she feels dizzy.  Complains of sore throat.   Objective: Vital signs in last 24 hours: Temp:  [97.6 F (36.4 C)-100.1 F (37.8 C)] 99.1 F (37.3 C) (01/06 0557) Pulse Rate:  [76-104] 88  (01/06 0557) Resp:  [18-20] 18  (01/06 0557) BP: (154-164)/(79-93) 154/79 mmHg (01/06 0557) SpO2:  [93 %-96 %] 95 % (01/06 0557) Weight change:  Last BM Date: 05/11/11  Intake/Output from previous day:  Intake/Output Summary (Last 24 hours) at 05/12/11 0936 Last data filed at 05/12/11 0700  Gross per 24 hour  Intake 4518.35 ml  Output      0 ml  Net 4518.35 ml     Physical Exam:  Gen:  NAD Cardiovascular:  RRR, No M/R/G Respiratory: Lungs CTAB Gastrointestinal: Abdomen soft, NT/ND with normal active bowel sounds. Extremities: No C/E/C     Lab Results: Basic Metabolic Panel:  Lab 05/11/11 1610 05/10/11 0719 05/07/11 1954 05/06/11 0630  NA 135 133* 135 137  K 4.0 4.1 -- --  CL 102 100 98 99  CO2 28 28 30 28   GLUCOSE 133* 104* 92 115*  BUN 7 10 13 8   CREATININE 1.05 1.11* 1.05 0.91  CALCIUM 9.1 9.8 9.9 10.1  MG -- -- -- --  PHOS -- -- -- --   GFR Estimated Creatinine Clearance: 51.8 ml/min (by  C-G formula based on Cr of 1.05). Liver Function Tests:  Lab 05/11/11 0620 05/10/11 0719 05/07/11 1954 05/06/11 0630  AST 16 21 25 27   ALT 20 22 21 25   ALKPHOS 56 59 70 80  BILITOT 0.3 0.2* 0.3 0.6  PROT 5.7* 6.2 6.4 7.5  ALBUMIN 2.8* 3.2* 3.5 4.0    Lab 05/11/11 0620 05/10/11 0719  LIPASE 65* 151*  AMYLASE -- --    CBC:  Lab 05/11/11 0620 05/10/11 0719 05/06/11 0630  WBC 3.5* 4.7 7.2  NEUTROABS -- 2.3 3.4  HGB 12.1 12.5 15.4*  HCT 34.6* 36.5 42.3  MCV 87.8 87.3 83.9  PLT 169 176 254   Cardiac Enzymes:  Lab 05/10/11 2125 05/10/11 1351  CKTOTAL 128 216*  CKMB 3.0 3.8  CKMBINDEX -- --  TROPONINI <0.30 <0.30   CBG:  Lab 05/12/11 0736 05/11/11 2159 05/11/11 1659 05/11/11 1131 05/11/11 0749  GLUCAP 127* 140* 90 149* 188*   Hgb A1c  Basename 05/10/11 0719  HGBA1C 6.4*  Results for BUNA, CUPPETT (MRN [id number]) as of 05/11/2011 15:26  Ref. Range 05/10/2011 07:37  Color, Urine Latest Range: YELLOW  YELLOW  APPearance Latest Range: CLEAR  CLEAR  Specific Gravity, Urine Latest Range: 1.005-1.030  1.005  pH Latest Range: 5.0-8.0  6.5  Glucose, UA Latest Range: NEGATIVE mg/dL NEGATIVE  Bilirubin Urine Latest Range: NEGATIVE  NEGATIVE  Ketones, ur Latest Range: NEGATIVE mg/dL NEGATIVE  Protein Latest Range: NEGATIVE mg/dL NEGATIVE  Urobilinogen, UA Latest Range: 0.0-1.0 mg/dL 0.2  Nitrite Latest Range: NEGATIVE  NEGATIVE  Leukocytes, UA Latest Range: NEGATIVE  NEGATIVE   Studies/Results: Mr Brain Wo Contrast  05/10/2011   IMPRESSION: Normal MRI of the brain for age.  Original Report Authenticated By: Jamesetta Orleans. MATTERN, M.D.   Ct Abdomen Pelvis W Contrast  05/10/2011 IMPRESSION:  1.  No evidence of acute pancreatitis. 2.  Small low attenuation structure near the tail of the pancreas may represent a 13 mm pseudocyst. 3.  Small indeterminate lesion in the upper pole of the left kidney.  Consider follow-up. 4.  Multiple colonic diverticula diffusely. 5.  Somewhat  thick-walled urinary bladder.  Cannot exclude cystitis.  Original Report Authenticated By: Juline Patch, M.D.   Dg Abd Acute W/chest  05/10/2011  IMPRESSION: Negative abdominal radiographs.  No acute cardiopulmonary disease. Question chronic bronchitis.  Original Report Authenticated By: Elsie Stain, M.D.    Medications: Scheduled Meds:    . ARIPiprazole  5 mg Oral BH-qamhs  . clonazePAM  0.5 mg Oral BH-q8a2phs  . enoxaparin  40 mg Subcutaneous Q24H  . gabapentin  200 mg Oral BH-q8a2phs  . insulin aspart  0-9 Units Subcutaneous TID WC  . lisinopril  10 mg Oral Daily  . pantoprazole  40 mg Oral Q1200  . traZODone  150 mg Oral QHS   Continuous Infusions:    . sodium chloride 75 mL/hr at 05/11/11 2130   PRN Meds:.acetaminophen, albuterol, magnesium hydroxide, menthol-cetylpyridinium, ondansetron (ZOFRAN) IV, ondansetron, DISCONTD: morphine Antibiotics: Anti-infectives    None       Assessment/Plan:  Principal Problem:  *Nausea vomiting, diarrhea and abdominal pain secondary to pancreatitis/ serum lipase elevation / pancreatic pseudocyst. The patient was admitted and put on a CL diet.  PRN pain and nausea medications were provided. Unclear if symptoms are from pancreatitis or viral gastroenteritis.  Lipase has come down.  We advanced her diet and 05/11/11, and she tolerated this without a return of her symptoms. Active Problems:  Schizoaffective disorder, bipolar type Continue psychotropic medications.  Seen by Dr. Ferol Luz who recommended transfer back to [facility] when medically stable.  No beds currently available.    Cannabis abuse / Alcohol dependence / Substance abuse Transfer back to [facility] for further treatment, she is medically stable.   Active smoker Tobacco cessation counseling per nursing staff ordered.  Mental retardation Likely to d/c to a group home/ALF.  Diabetes Mellitus CBGs 90-188.  Continue SSI.  Does not appear that she was on medication prior to admission, so  likely diet controlled.  Her Hgb A1c was 6.4%.  Dizziness Check H&H given episode of bleeding.  Sore Throat Cepacol lozenges.  Likely viral.      LOS: 5 days   Alison Aldo, MD Pager [phone]  05/12/2011, 9:36 AM

## 2011-05-13 ENCOUNTER — Inpatient Hospital Stay (HOSPITAL_COMMUNITY): Payer: Medicaid Other

## 2011-05-13 DIAGNOSIS — J069 Acute upper respiratory infection, unspecified: Secondary | ICD-10-CM | POA: Diagnosis present

## 2011-05-13 DIAGNOSIS — J9801 Acute bronchospasm: Secondary | ICD-10-CM | POA: Diagnosis present

## 2011-05-13 LAB — GLUCOSE, CAPILLARY
Glucose-Capillary: 106 mg/dL — ABNORMAL HIGH (ref 70–99)
Glucose-Capillary: 114 mg/dL — ABNORMAL HIGH (ref 70–99)

## 2011-05-13 LAB — CULTURE, BLOOD (ROUTINE X 2)
Culture  Setup Time: 201301070907
Culture: NO GROWTH

## 2011-05-13 MED ORDER — PROMETHAZINE HCL 25 MG/ML IJ SOLN
25.0000 mg | Freq: Four times a day (QID) | INTRAMUSCULAR | Status: DC | PRN
  Administered 2011-05-13: 12.5 mg via INTRAVENOUS
  Filled 2011-05-13: qty 1

## 2011-05-13 MED ORDER — ALBUTEROL SULFATE (5 MG/ML) 0.5% IN NEBU
2.5000 mg | INHALATION_SOLUTION | Freq: Three times a day (TID) | RESPIRATORY_TRACT | Status: DC
  Administered 2011-05-13 – 2011-05-14 (×3): 2.5 mg via RESPIRATORY_TRACT
  Filled 2011-05-13 (×3): qty 0.5

## 2011-05-13 MED ORDER — IPRATROPIUM BROMIDE 0.02 % IN SOLN
0.5000 mg | Freq: Three times a day (TID) | RESPIRATORY_TRACT | Status: DC
  Administered 2011-05-13 – 2011-05-14 (×3): 0.5 mg via RESPIRATORY_TRACT
  Filled 2011-05-13 (×3): qty 2.5

## 2011-05-13 NOTE — Progress Notes (Signed)
Per [facility], no beds available, at this time.  Providence Crosby, LCSWA Clinical Social Work [phone]

## 2011-05-13 NOTE — Progress Notes (Signed)
Patient is have nausea and small amt of emesis clear with food particles noted. Patient has elevated temperature of 101.5 and elevated pulses 120. Patient refuses tylenol. Made on call aware of patients conditions and orders was entered for patient.

## 2011-05-13 NOTE — Progress Notes (Signed)
Notified Pt that there are no beds at [facility], at this time.  Providence Crosby, LCSWA Clinical Social Work [phone]

## 2011-05-13 NOTE — Progress Notes (Signed)
PATIENT DETAILS Name: [name] Age: 56 y.o. Sex: female Date of Birth: 08-23-1955 Admit Date: 05/07/2011 PCP:No primary provider on file. POA:   CONSULTS: None  Interval History: [name] is a 56 year old female who was admitted to [facility] on 05/07/11 for treatment of polysubstance abuse and bipolar / schizoaffective disorder.  While there, she developed abdominal pain, diarrhea and vomiting on 05/10/11, and was brought to the ED for further evaluation where she was found to have an elevated lipase.  The patient's symptoms resolved with conservative therapy, and she is medically stable for transfer back to [facility].  ROS: The patient denies any further nausea, vomiting, or diarrhea.  Appetite is good.  RN reports that she has been febrile and has had new onset wheezing. She has some chest tightness and sore throat.     Objective: Vital signs in last 24 hours: Temp:  [99.9 F (37.7 C)-101.5 F (38.6 C)] 100.1 F (37.8 C) (01/07 1445) Pulse Rate:  [100-121] 105  (01/07 1445) Resp:  [20-24] 22  (01/07 1445) BP: (138-177)/(83-99) 166/91 mmHg (01/07 1445) SpO2:  [85 %-98 %] 96 % (01/07 1445) Weight change:  Last BM Date: 05/12/11  Intake/Output from previous day:  Intake/Output Summary (Last 24 hours) at 05/13/11 1602 Last data filed at 05/13/11 1503  Gross per 24 hour  Intake   2740 ml  Output   3400 ml  Net   -660 ml     Physical Exam:  Gen:  NAD Cardiovascular:  RRR, No M/R/G Respiratory: Lungs CTAB Gastrointestinal: Abdomen soft, NT/ND with normal active bowel sounds. Extremities: No C/E/C     Lab Results: Basic Metabolic Panel:  Lab 05/11/11 4098 05/10/11 0719 05/07/11 1954  NA 135 133* 135  K 4.0 4.1 --  CL 102 100 98  CO2 28 28 30   GLUCOSE 133* 104* 92  BUN 7 10 13   CREATININE 1.05 1.11* 1.05  CALCIUM 9.1 9.8 9.9  MG -- -- --  PHOS -- -- --   GFR Estimated Creatinine Clearance: 51.8 ml/min (by C-G formula based on Cr of 1.05). Liver Function  Tests:  Lab 05/11/11 0620 05/10/11 0719 05/07/11 1954  AST 16 21 25   ALT 20 22 21   ALKPHOS 56 59 70  BILITOT 0.3 0.2* 0.3  PROT 5.7* 6.2 6.4  ALBUMIN 2.8* 3.2* 3.5    Lab 05/11/11 0620 05/10/11 0719  LIPASE 65* 151*  AMYLASE -- --    CBC:  Lab 05/12/11 1109 05/11/11 0620 05/10/11 0719  WBC -- 3.5* 4.7  NEUTROABS -- -- 2.3  HGB 12.2 12.1 12.5  HCT 35.9* 34.6* 36.5  MCV -- 87.8 87.3  PLT -- 169 176   Cardiac Enzymes:  Lab 05/10/11 2125 05/10/11 1351  CKTOTAL 128 216*  CKMB 3.0 3.8  CKMBINDEX -- --  TROPONINI <0.30 <0.30   CBG:  Lab 05/13/11 1132 05/13/11 0755 05/12/11 2135 05/12/11 1712 05/12/11 1124  GLUCAP 171* 106* 144* 140* 180*   Hgb A1c No results found for this basename: HGBA1C:2 in the last 72 hoursResults for [name], [name] (MRN [id number]) as of 05/11/2011 15:26  Ref. Range 05/10/2011 07:37  Color, Urine Latest Range: YELLOW  YELLOW  APPearance Latest Range: CLEAR  CLEAR  Specific Gravity, Urine Latest Range: 1.005-1.030  1.005  pH Latest Range: 5.0-8.0  6.5  Glucose, UA Latest Range: NEGATIVE mg/dL NEGATIVE  Bilirubin Urine Latest Range: NEGATIVE  NEGATIVE  Ketones, ur Latest Range: NEGATIVE mg/dL NEGATIVE  Protein Latest Range: NEGATIVE mg/dL NEGATIVE  Urobilinogen, UA Latest Range: 0.0-1.0 mg/dL 0.2  Nitrite Latest Range: NEGATIVE  NEGATIVE  Leukocytes, UA Latest Range: NEGATIVE  NEGATIVE   Studies/Results: Mr Brain Wo Contrast  05/10/2011   IMPRESSION: Normal MRI of the brain for age.  Original Report Authenticated By: Jamesetta Orleans. MATTERN, M.D.   Ct Abdomen Pelvis W Contrast  05/10/2011 IMPRESSION:  1.  No evidence of acute pancreatitis. 2.  Small low attenuation structure near the tail of the pancreas may represent a 13 mm pseudocyst. 3.  Small indeterminate lesion in the upper pole of the left kidney.  Consider follow-up. 4.  Multiple colonic diverticula diffusely. 5.  Somewhat thick-walled urinary bladder.  Cannot exclude cystitis.  Original  Report Authenticated By: Juline Patch, M.D.   Dg Abd Acute W/chest  05/10/2011  IMPRESSION: Negative abdominal radiographs.  No acute cardiopulmonary disease. Question chronic bronchitis.  Original Report Authenticated By: Elsie Stain, M.D.    Medications: Scheduled Meds:    . albuterol  2.5 mg Nebulization TID  . ARIPiprazole  5 mg Oral BH-qamhs  . clonazePAM  0.5 mg Oral BH-q8a2phs  . enoxaparin  40 mg Subcutaneous Q24H  . gabapentin  200 mg Oral BH-q8a2phs  . insulin aspart  0-9 Units Subcutaneous TID WC  . ipratropium  0.5 mg Nebulization TID  . lisinopril  10 mg Oral Daily  . nicotine  7 mg Transdermal Daily  . pantoprazole  40 mg Oral Q1200  . traZODone  150 mg Oral QHS   Continuous Infusions:   PRN Meds:.acetaminophen, albuterol, magnesium hydroxide, menthol-cetylpyridinium, ondansetron (ZOFRAN) IV, ondansetron, promethazine Antibiotics: Anti-infectives    None       Assessment/Plan:  Principal Problem:  *Nausea vomiting, diarrhea and abdominal pain secondary to pancreatitis/ serum lipase elevation / pancreatic pseudocyst. The patient was admitted and put on a CL diet.  PRN pain and nausea medications were provided. Unclear if symptoms are from pancreatitis or viral gastroenteritis.  Lipase has come down.  We advanced her diet and 05/11/11, and she tolerated this without a return of her symptoms. Active Problems:  Schizoaffective disorder, bipolar type Continue psychotropic medications.  Seen by Dr. Ferol Luz who recommended transfer back to [facility] when medically stable.  No beds currently available.    Cannabis abuse / Alcohol dependence / Substance abuse Transfer back to [facility] for further treatment, she is medically stable.   Active smoker Tobacco cessation counseling per nursing staff ordered.  Mental retardation Likely to d/c to a group home/ALF.  Diabetes Mellitus CBGs 106-180.  Continue SSI.  Does not appear that she was on medication prior to admission, so likely  diet controlled.  Her Hgb A1c was 6.4%.  Dizziness H&H stable.   Sore Throat/Viral URI with bronchospasm Cepacol lozenges.  Likely viral.  Add delsym.  Supportive care with tylenol, PRN nebs.    LOS: 6 days   Hillery Aldo, MD Pager [phone]  05/13/2011, 4:02 PM

## 2011-05-13 NOTE — Discharge Summary (Signed)
  Identifying information: This is a 56 year old female this was a voluntary admission.  Date of admission: 05/07/2011 Date of discharge: 05/10/2011.  Discharge diagnoses: Axis I: Schizoaffective disorder, bipolar type, polysubstance abuse. Axis II: No diagnosis. Axis III: Nausea and vomiting of unknown origin, diabetes mellitus type 2 controlled. Axis IV: Homeless Axis V: 39, past year not known.  Course of hospitalization:  Greyson was admitted for acute stabilization unit after she was kicked out of her daughter's home. She has a history of schizoaffective disorder and had not been taking her medications. She presented with paranoia and agitation. She was started on Abilify 5 mg twice a day, which he tolerated well. In the early morning hours of January 5 she developed significant diarrhea with nausea and vomiting and was transferred to our emergency room for medical evaluation. She was found to have an elevated lipase and was admitted to the medical unit.  We will consider her for readmission when she is medically cleared.

## 2011-05-13 NOTE — Consult Note (Signed)
Reason for Consult: Transferred from [facility] to [facility] for complaint of stomach pain. Referring Physician: Dr. Darnelle Catalan [phone]  [name] is an 56 y.o. female.  HPI: This patient began complaining of stomach pain. She is transferred to [facility] for evaluation.  Past Medical History  Diagnosis Date  . Diabetes mellitus   . Bipolar 1 disorder   . Schizoaffective disorder, bipolar type   . Hypertension     Past Surgical History  Procedure Date  . Arm surgery     History reviewed. No pertinent family history.  Social History:  reports that she has been smoking Cigarettes.  She has a 5 pack-year smoking history. She has never used smokeless tobacco. She reports that she drinks alcohol. She reports that she uses illicit drugs (Marijuana).  Allergies: No Known Allergies  Medications: I have reviewed the patient's current medications.  Results for orders placed during the hospital encounter of 05/07/11 (from the past 48 hour(s))  GLUCOSE, CAPILLARY     Status: Normal   Collection Time   05/11/11  4:59 PM      Component Value Range Comment   Glucose-Capillary 90  70 - 99 (mg/dL)   GLUCOSE, CAPILLARY     Status: Abnormal   Collection Time   05/11/11  9:59 PM      Component Value Range Comment   Glucose-Capillary 140 (*) 70 - 99 (mg/dL)    Comment 1 Notify RN      Comment 2 Documented in Chart     GLUCOSE, CAPILLARY     Status: Abnormal   Collection Time   05/12/11  7:36 AM      Component Value Range Comment   Glucose-Capillary 127 (*) 70 - 99 (mg/dL)    Comment 1 Notify RN     HEMOGLOBIN AND HEMATOCRIT, BLOOD     Status: Abnormal   Collection Time   05/12/11 11:09 AM      Component Value Range Comment   Hemoglobin 12.2  12.0 - 15.0 (g/dL)    HCT 02.7 (*) 25.3 - 46.0 (%)   GLUCOSE, CAPILLARY     Status: Abnormal   Collection Time   05/12/11 11:24 AM      Component Value Range Comment   Glucose-Capillary 180 (*) 70 - 99 (mg/dL)    Comment 1 Notify RN     GLUCOSE, CAPILLARY     Status:  Abnormal   Collection Time   05/12/11  5:12 PM      Component Value Range Comment   Glucose-Capillary 140 (*) 70 - 99 (mg/dL)    Comment 1 Notify RN     GLUCOSE, CAPILLARY     Status: Abnormal   Collection Time   05/12/11  9:35 PM      Component Value Range Comment   Glucose-Capillary 144 (*) 70 - 99 (mg/dL)   GLUCOSE, CAPILLARY     Status: Abnormal   Collection Time   05/13/11  7:55 AM      Component Value Range Comment   Glucose-Capillary 106 (*) 70 - 99 (mg/dL)    Comment 1 Notify RN     GLUCOSE, CAPILLARY     Status: Abnormal   Collection Time   05/13/11 11:32 AM      Component Value Range Comment   Glucose-Capillary 171 (*) 70 - 99 (mg/dL)    Comment 1 Notify RN       Dg Chest Port 1 View  05/13/2011  *RADIOLOGY REPORT*  Clinical Data: Fever, evaluate for aspiration  PORTABLE CHEST - 1 VIEW  Comparison: Acute abdominal radiographic series - 05/10/2011; abdominal CT - 05/10/2011  Findings: Grossly unchanged cardiac silhouette and mediastinal contours.  No focal airspace opacities.  No pleural effusion or pneumothorax.  Grossly unchanged bones.  IMPRESSION: No acute cardiopulmonary disease.  Specifically, no evidence of pneumonia.  Original Report Authenticated By: Waynard Reeds, M.D.    Review of Systems  Unable to perform ROS: mental acuity   Blood pressure 166/91, pulse 105, temperature 100.1 F (37.8 C), temperature source Other (Comment), resp. rate 22, height 5\' 1"  (1.549 m), weight 65.318 kg (144 lb), last menstrual period 04/19/2011, SpO2 96.00%. Physical Exam  Assessment/Plan: Today [name] is resting in bed but is alert. She has poor articulation because she is edentulous. She states that she has chest pain neck pain ear pain and wants to be evaluated. She is told that she was transferred from [facility] and that she has a history of drinking alcohol. She initially denies that but when she is told that her daughter reported she tried sixpacks of beer into the house she admits that  she drinks beer and liquor she denies smoking marijuana or crack cocaine. She denies ever having blackouts or seizures. She is easily distracted and there may be evidence early to moderate dementia. Her cognitive capacity is not established end prior history of learning disability or cognitive impairment is unknown. This may need to be evaluated to determine her ability to adhere to any medication regimen. She agrees to return to [facility] and expresses a desire to be in a group home. She says she does not want to return to [facility]. RECOMMENDATION: 1 plan transfer to [facility] when medically stable.  [name] 05/13/2011, 4:41 PM

## 2011-05-13 NOTE — Progress Notes (Signed)
Patient complained of PIV hurting . Removed. Refused for additional IV to be placed. ? Discharge 05-13-11 Patient now needing IV medication for vomiting. Patient request for IV team to place the IV in order to rec'd medication . Will make IV therapy aware.

## 2011-05-14 DIAGNOSIS — F1020 Alcohol dependence, uncomplicated: Secondary | ICD-10-CM

## 2011-05-14 LAB — GLUCOSE, CAPILLARY
Glucose-Capillary: 121 mg/dL — ABNORMAL HIGH (ref 70–99)
Glucose-Capillary: 126 mg/dL — ABNORMAL HIGH (ref 70–99)
Glucose-Capillary: 229 mg/dL — ABNORMAL HIGH (ref 70–99)

## 2011-05-14 LAB — URINE CULTURE
Colony Count: 40000
Culture  Setup Time: 201301070852

## 2011-05-14 MED ORDER — METHYLPREDNISOLONE SODIUM SUCC 125 MG IJ SOLR
60.0000 mg | Freq: Once | INTRAMUSCULAR | Status: AC
  Administered 2011-05-14: 60 mg via INTRAVENOUS
  Filled 2011-05-14: qty 0.96

## 2011-05-14 MED ORDER — PREDNISONE 20 MG PO TABS
40.0000 mg | ORAL_TABLET | Freq: Every day | ORAL | Status: DC
  Administered 2011-05-15: 40 mg via ORAL
  Filled 2011-05-14: qty 2

## 2011-05-14 NOTE — Progress Notes (Signed)
PATIENT DETAILS Name: [name] Age: 56 y.o. Sex: female Date of Birth: [birth year] Admit Date: 05/07/2011 PCP:No primary provider on file. POA:   CONSULTS: None  Interval History: [name] is a 56 year old female who was admitted to [facility] on 05/07/11 for treatment of polysubstance abuse and bipolar / schizoaffective disorder.  While there, she developed abdominal pain, diarrhea and vomiting on 05/10/11, and was brought to the ED for further evaluation where she was found to have an elevated lipase.  The patient's symptoms resolved with conservative therapy, and she is medically stable for transfer back to [facility].  ROS: The patient denies any further nausea, vomiting, or diarrhea.  Appetite is good.  States: "I need a breathing treatment".  Objective: Vital signs in last 24 hours: Temp:  [98 F (36.7 C)-98.2 F (36.8 C)] 98 F (36.7 C) (01/08 1512) Pulse Rate:  [81-99] 99  (01/08 1512) Resp:  [18-20] 18  (01/08 1512) BP: (126-148)/(68-98) 126/68 mmHg (01/08 1512) SpO2:  [91 %-95 %] 95 % (01/08 1512) Weight change:  Last BM Date: 05/13/11  Intake/Output from previous day:  Intake/Output Summary (Last 24 hours) at 05/14/11 1718 Last data filed at 05/14/11 1646  Gross per 24 hour  Intake   2160 ml  Output   3177 ml  Net  -1017 ml     Physical Exam:  Gen:  NAD Cardiovascular:  RRR, No M/R/G Respiratory: Lungs with course expiratory wheezes. Gastrointestinal: Abdomen soft, NT/ND with normal active bowel sounds. Extremities: No C/E/C     Lab Results: Basic Metabolic Panel:  Lab 05/11/11 3086 05/10/11 0719 05/07/11 1954  NA 135 133* 135  K 4.0 4.1 --  CL 102 100 98  CO2 28 28 30   GLUCOSE 133* 104* 92  BUN 7 10 13   CREATININE 1.05 1.11* 1.05  CALCIUM 9.1 9.8 9.9  MG -- -- --  PHOS -- -- --   GFR Estimated Creatinine Clearance: 51.8 ml/min (by C-G formula based on Cr of 1.05). Liver Function Tests:  Lab 05/11/11 0620 05/10/11 0719 05/07/11 1954  AST 16 21 25     ALT 20 22 21   ALKPHOS 56 59 70  BILITOT 0.3 0.2* 0.3  PROT 5.7* 6.2 6.4  ALBUMIN 2.8* 3.2* 3.5    Lab 05/11/11 0620 05/10/11 0719  LIPASE 65* 151*  AMYLASE -- --    CBC:  Lab 05/12/11 1109 05/11/11 0620 05/10/11 0719  WBC -- 3.5* 4.7  NEUTROABS -- -- 2.3  HGB 12.2 12.1 12.5  HCT 35.9* 34.6* 36.5  MCV -- 87.8 87.3  PLT -- 169 176   Cardiac Enzymes:  Lab 05/10/11 2125 05/10/11 1351  CKTOTAL 128 216*  CKMB 3.0 3.8  CKMBINDEX -- --  TROPONINI <0.30 <0.30   CBG:  Lab 05/14/11 1607 05/14/11 1118 05/14/11 0722 05/13/11 1705 05/13/11 1132  GLUCAP 142* 126* 121* 114* 171*     Ref. Range 05/10/2011 07:37  Color, Urine Latest Range: YELLOW  YELLOW  APPearance Latest Range: CLEAR  CLEAR  Specific Gravity, Urine Latest Range: 1.005-1.030  1.005  pH Latest Range: 5.0-8.0  6.5  Glucose, UA Latest Range: NEGATIVE mg/dL NEGATIVE  Bilirubin Urine Latest Range: NEGATIVE  NEGATIVE  Ketones, ur Latest Range: NEGATIVE mg/dL NEGATIVE  Protein Latest Range: NEGATIVE mg/dL NEGATIVE  Urobilinogen, UA Latest Range: 0.0-1.0 mg/dL 0.2  Nitrite Latest Range: NEGATIVE  NEGATIVE  Leukocytes, UA Latest Range: NEGATIVE  NEGATIVE   Studies/Results: Mr Brain Wo Contrast  05/10/2011   IMPRESSION: Normal MRI of  the brain for age.  Original Report Authenticated By: Jamesetta Orleans. MATTERN, M.D.   Ct Abdomen Pelvis W Contrast  05/10/2011 IMPRESSION:  1.  No evidence of acute pancreatitis. 2.  Small low attenuation structure near the tail of the pancreas may represent a 13 mm pseudocyst. 3.  Small indeterminate lesion in the upper pole of the left kidney.  Consider follow-up. 4.  Multiple colonic diverticula diffusely. 5.  Somewhat thick-walled urinary bladder.  Cannot exclude cystitis.  Original Report Authenticated By: Juline Patch, M.D.   Dg Abd Acute W/chest  05/10/2011  IMPRESSION: Negative abdominal radiographs.  No acute cardiopulmonary disease. Question chronic bronchitis.  Original Report  Authenticated By: Elsie Stain, M.D.    Medications: Scheduled Meds:    . ARIPiprazole  5 mg Oral BH-qamhs  . clonazePAM  0.5 mg Oral BH-q8a2phs  . enoxaparin  40 mg Subcutaneous Q24H  . gabapentin  200 mg Oral BH-q8a2phs  . insulin aspart  0-9 Units Subcutaneous TID WC  . lisinopril  10 mg Oral Daily  . nicotine  7 mg Transdermal Daily  . pantoprazole  40 mg Oral Q1200  . traZODone  150 mg Oral QHS  . DISCONTD: albuterol  2.5 mg Nebulization TID  . DISCONTD: ipratropium  0.5 mg Nebulization TID   Continuous Infusions:   PRN Meds:.acetaminophen, albuterol, magnesium hydroxide, menthol-cetylpyridinium, ondansetron (ZOFRAN) IV, ondansetron, promethazine Antibiotics: Anti-infectives    None       Assessment/Plan:  Principal Problem:  *Nausea vomiting, diarrhea and abdominal pain secondary to pancreatitis/ serum lipase elevation / pancreatic pseudocyst. The patient was admitted and put on a CL diet.  PRN pain and nausea medications were provided. Unclear if symptoms were from pancreatitis or viral gastroenteritis.  Lipase has come down.  We advanced her diet and 05/11/11, and she tolerated this without a return of her symptoms. Active Problems:  Schizoaffective disorder, bipolar type Continue psychotropic medications.  Seen by Dr. Ferol Luz who recommended transfer back to [facility] when medically stable.  No beds currently available.    Cannabis abuse / Alcohol dependence / Substance abuse Transfer back to [facility] for further treatment, she is medically stable.   Active smoker Tobacco cessation counseling per nursing staff ordered.  Mental retardation Likely to d/c to a group home/ALF.  Diabetes Mellitus CBGs 114-171.  Continue SSI.  Does not appear that she was on medication prior to admission, so likely diet controlled.  Her Hgb A1c was 6.4%.  Steroids may cause a transient rise in her CBGs.  Monitor.  Dizziness H&H stable.   Sore Throat/Viral URI with bronchospasm Cepacol lozenges.   Likely viral.  Add delsym.  Supportive care with tylenol, PRN nebs.  Will give her a dose of solumedrol now and start  prednisone 40 mg starting tomorrow, given ongoing bronchospasm.  Prednisone can be weaned by 10 mg daily as tolerated.  Disposition Awaiting transfer back to [facility].   LOS: 7 days   Hillery Aldo, MD Pager [phone]  05/14/2011, 5:18 PM

## 2011-05-14 NOTE — Progress Notes (Addendum)
[facility] unsure of bed status, at this time. Alice at [facility] to contact CSW when bed available.   Per Pt's request, LM for financial counselor to speak with Pt regarding disability.  Providence Crosby, LCSWA  Clinical Social Work  [phone]

## 2011-05-14 NOTE — Consult Note (Signed)
Reason for Consult: altered mental status, alcohol dependence Referring Physician: Dr. Debria Garret is an 56 y.o. female.  HPI: Pt admitted to [facility] and once admitted complained of abdominal pain.  She was transferred to [facility] for evaluation.   Past Medical History  Diagnosis Date  . Diabetes mellitus   . Bipolar 1 disorder   . Schizoaffective disorder, bipolar type   . Hypertension     Past Surgical History  Procedure Date  . Arm surgery     History reviewed. No pertinent family history.  Social History:  reports that she has been smoking Cigarettes.  She has a 5 pack-year smoking history. She has never used smokeless tobacco. She reports that she drinks alcohol. She reports that she uses illicit drugs (Marijuana).  Allergies: No Known Allergies  Medications: I have reviewed the patient's current medications.  Results for orders placed during the hospital encounter of 05/07/11 (from the past 48 hour(s))  GLUCOSE, CAPILLARY     Status: Abnormal   Collection Time   05/12/11  5:12 PM      Component Value Range Comment   Glucose-Capillary 140 (*) 70 - 99 (mg/dL)    Comment 1 Notify RN     GLUCOSE, CAPILLARY     Status: Abnormal   Collection Time   05/12/11  9:35 PM      Component Value Range Comment   Glucose-Capillary 144 (*) 70 - 99 (mg/dL)   URINE CULTURE     Status: Normal   Collection Time   05/13/11  2:57 AM      Component Value Range Comment   Specimen Description URINE, CLEAN CATCH      Special Requests Normal      Setup Time [id number]      Colony Count 40,000 COLONIES/ML      Culture        Value: Multiple bacterial morphotypes present, none predominant. Suggest appropriate recollection if clinically indicated.   Report Status 05/14/2011 FINAL     CULTURE, BLOOD (ROUTINE X 2)     Status: Normal (Preliminary result)   Collection Time   05/13/11  3:00 AM      Component Value Range Comment   Specimen Description BLOOD IV      Special Requests BOTTLES DRAWN  AEROBIC AND ANAEROBIC 5 CC EA      Setup Time [id number]      Culture        Value:        BLOOD CULTURE RECEIVED NO GROWTH TO DATE CULTURE WILL BE HELD FOR 5 DAYS BEFORE ISSUING A FINAL NEGATIVE REPORT   Report Status PENDING     CULTURE, BLOOD (ROUTINE X 2)     Status: Normal (Preliminary result)   Collection Time   05/13/11  3:06 AM      Component Value Range Comment   Specimen Description BLOOD LEFT FOREARM      Special Requests BOTTLES DRAWN AEROBIC ONLY 7 CC      Setup Time [id number]      Culture        Value:        BLOOD CULTURE RECEIVED NO GROWTH TO DATE CULTURE WILL BE HELD FOR 5 DAYS BEFORE ISSUING A FINAL NEGATIVE REPORT   Report Status PENDING     GLUCOSE, CAPILLARY     Status: Abnormal   Collection Time   05/13/11  7:55 AM      Component Value Range Comment   Glucose-Capillary 106 (*) 70 -  99 (mg/dL)    Comment 1 Notify RN     GLUCOSE, CAPILLARY     Status: Abnormal   Collection Time   05/13/11 11:32 AM      Component Value Range Comment   Glucose-Capillary 171 (*) 70 - 99 (mg/dL)    Comment 1 Notify RN     GLUCOSE, CAPILLARY     Status: Abnormal   Collection Time   05/13/11  5:05 PM      Component Value Range Comment   Glucose-Capillary 114 (*) 70 - 99 (mg/dL)    Comment 1 Notify RN     GLUCOSE, CAPILLARY     Status: Abnormal   Collection Time   05/14/11  7:22 AM      Component Value Range Comment   Glucose-Capillary 121 (*) 70 - 99 (mg/dL)    Comment 1 Notify RN     GLUCOSE, CAPILLARY     Status: Abnormal   Collection Time   05/14/11 11:18 AM      Component Value Range Comment   Glucose-Capillary 126 (*) 70 - 99 (mg/dL)    Comment 1 Notify RN       Dg Chest Port 1 View  05/13/2011  *RADIOLOGY REPORT*  Clinical Data: Fever, evaluate for aspiration  PORTABLE CHEST - 1 VIEW  Comparison: Acute abdominal radiographic series - 05/10/2011; abdominal CT - 05/10/2011  Findings: Grossly unchanged cardiac silhouette and mediastinal contours.  No focal airspace opacities.   No pleural effusion or pneumothorax.  Grossly unchanged bones.  IMPRESSION: No acute cardiopulmonary disease.  Specifically, no evidence of pneumonia.  Original Report Authenticated By: Waynard Reeds, M.D.    Review of Systems  Unable to perform ROS: mental acuity   Blood pressure 148/98, pulse 98, temperature 98.2 F (36.8 C), temperature source Oral, resp. rate 20, height 5\' 1"  (1.549 m), weight 65.318 kg (144 lb), last menstrual period 04/19/2011, SpO2 91.00%. Physical Exam  Assessment/Plan: [name] is awake and alert.  She has spontaneous speech and a sense of humor.  She is self-absorbed and appears to be somewhat cognitively limited.  She does not complain of anything and asks when she is going to [facility].  She demonstrates some degree of reality testing.  She does not demonstrate depressed mood or suicidal thoughts.  She needs to return to [facility] for a full evaluation.  Collaborative information and observation of ADL skills and capacity will aid in discharge planning.  Her daughter's account supports need for assisted living and medication supervision.  RECOMMENDATION:.   2.  When medically cleared, patient is to transfer to [facility]; transfer pending 3. Discussed with Dr. Micah Noel, Trystian Crisanto 05/14/2011, 2:56 PM

## 2011-05-14 NOTE — Progress Notes (Addendum)
Per [facility], no bed currently, however Pt is first in line for a bed.    Notified Care Coordinator, Aram Beecham, and Pt re: [facility] bed status.  Pt asking about assistance with disability.  CSW to notify financial counselor.  CSW to continue to follow.  Providence Crosby, LCSWA Clinical Social Work [phone]

## 2011-05-14 NOTE — Progress Notes (Signed)
Per MD, Pt not med stable, at this time.  Providence Crosby, LCSWA Clinical Social Work [phone]

## 2011-05-15 ENCOUNTER — Inpatient Hospital Stay (HOSPITAL_COMMUNITY)
Admission: AD | Admit: 2011-05-15 | Discharge: 2011-05-15 | Disposition: A | Discharge status: Still In Hospital | Payer: Medicaid Other | Source: Ambulatory Visit | Attending: Internal Medicine | Admitting: Internal Medicine

## 2011-05-15 ENCOUNTER — Inpatient Hospital Stay (HOSPITAL_COMMUNITY)
Admission: EM | Admit: 2011-05-15 | Discharge: 2011-05-16 | Disposition: A | Discharge status: Other Acute Inpatient Hospital | Payer: Medicaid Other | Source: Home / Self Care | Attending: Internal Medicine | Admitting: Internal Medicine

## 2011-05-15 ENCOUNTER — Encounter (HOSPITAL_COMMUNITY): Payer: Self-pay | Admitting: Emergency Medicine

## 2011-05-15 DIAGNOSIS — F1210 Cannabis abuse, uncomplicated: Secondary | ICD-10-CM | POA: Diagnosis present

## 2011-05-15 DIAGNOSIS — F1910 Other psychoactive substance abuse, uncomplicated: Secondary | ICD-10-CM | POA: Diagnosis present

## 2011-05-15 DIAGNOSIS — J029 Acute pharyngitis, unspecified: Secondary | ICD-10-CM | POA: Diagnosis present

## 2011-05-15 DIAGNOSIS — F17200 Nicotine dependence, unspecified, uncomplicated: Secondary | ICD-10-CM | POA: Diagnosis present

## 2011-05-15 DIAGNOSIS — R197 Diarrhea, unspecified: Principal | ICD-10-CM | POA: Diagnosis present

## 2011-05-15 DIAGNOSIS — F79 Unspecified intellectual disabilities: Secondary | ICD-10-CM | POA: Diagnosis present

## 2011-05-15 DIAGNOSIS — R748 Abnormal levels of other serum enzymes: Secondary | ICD-10-CM | POA: Diagnosis present

## 2011-05-15 DIAGNOSIS — F250 Schizoaffective disorder, bipolar type: Secondary | ICD-10-CM | POA: Diagnosis present

## 2011-05-15 DIAGNOSIS — J9801 Acute bronchospasm: Secondary | ICD-10-CM | POA: Diagnosis present

## 2011-05-15 DIAGNOSIS — E119 Type 2 diabetes mellitus without complications: Secondary | ICD-10-CM | POA: Diagnosis present

## 2011-05-15 DIAGNOSIS — F1020 Alcohol dependence, uncomplicated: Secondary | ICD-10-CM | POA: Diagnosis present

## 2011-05-15 DIAGNOSIS — K859 Acute pancreatitis without necrosis or infection, unspecified: Secondary | ICD-10-CM | POA: Diagnosis present

## 2011-05-15 DIAGNOSIS — J069 Acute upper respiratory infection, unspecified: Secondary | ICD-10-CM | POA: Diagnosis present

## 2011-05-15 DIAGNOSIS — K863 Pseudocyst of pancreas: Secondary | ICD-10-CM | POA: Diagnosis present

## 2011-05-15 DIAGNOSIS — R42 Dizziness and giddiness: Secondary | ICD-10-CM | POA: Diagnosis present

## 2011-05-15 DIAGNOSIS — R112 Nausea with vomiting, unspecified: Secondary | ICD-10-CM | POA: Diagnosis present

## 2011-05-15 DIAGNOSIS — R1013 Epigastric pain: Secondary | ICD-10-CM | POA: Diagnosis present

## 2011-05-15 HISTORY — DX: Acute pancreatitis without necrosis or infection, unspecified: K85.90

## 2011-05-15 LAB — STOOL CULTURE: Special Requests: NORMAL

## 2011-05-15 LAB — BASIC METABOLIC PANEL
GFR calc Af Amer: 65 mL/min — ABNORMAL LOW (ref >90)
GFR calc non Af Amer: 56 mL/min — ABNORMAL LOW (ref >90)
Potassium: 4.8 mEq/L (ref 3.5–5.1)
Sodium: 135 mEq/L (ref 135–145)

## 2011-05-15 LAB — GLUCOSE, CAPILLARY
Glucose-Capillary: 196 mg/dL — ABNORMAL HIGH (ref 70–99)
Glucose-Capillary: 140 mg/dL — ABNORMAL HIGH (ref 70–99)

## 2011-05-15 LAB — CBC
Hemoglobin: 13 g/dL (ref 12.0–15.0)
RBC: 4.56 MIL/uL (ref 3.87–5.11)
WBC: 4 10*3/uL (ref 4.0–10.5)

## 2011-05-15 MED ORDER — NICOTINE 7 MG/24HR TD PT24: 1.0000 | MEDICATED_PATCH | Freq: Every day | TRANSDERMAL | Status: DC

## 2011-05-15 MED ORDER — ACETAMINOPHEN 650 MG RE SUPP: 650.0000 mg | Freq: Four times a day (QID) | RECTAL | Status: DC | PRN

## 2011-05-15 MED ORDER — GABAPENTIN 100 MG PO CAPS: 200.0000 mg | ORAL_CAPSULE | ORAL | Status: DC

## 2011-05-15 MED ORDER — MENTHOL 3 MG MT LOZG: 1.0000 | LOZENGE | OROMUCOSAL | Status: DC | PRN

## 2011-05-15 MED ORDER — ACETAMINOPHEN 325 MG PO TABS
650.0000 mg | ORAL_TABLET | Freq: Four times a day (QID) | ORAL | Status: DC | PRN
  Administered 2011-05-16 (×2): 650 mg via ORAL
  Filled 2011-05-15 (×2): qty 2

## 2011-05-15 MED ORDER — CLONAZEPAM 0.5 MG PO TABS: 0.5000 mg | ORAL_TABLET | ORAL | Status: DC

## 2011-05-15 MED ORDER — INSULIN ASPART 100 UNIT/ML ~~LOC~~ SOLN
0.0000 [IU] | Freq: Three times a day (TID) | SUBCUTANEOUS | Status: DC
  Administered 2011-05-16 (×2): 2 [IU] via SUBCUTANEOUS
  Filled 2011-05-15: qty 3

## 2011-05-15 MED ORDER — ENOXAPARIN SODIUM 40 MG/0.4ML ~~LOC~~ SOLN
40.0000 mg | SUBCUTANEOUS | Status: DC
  Administered 2011-05-15: 40 mg via SUBCUTANEOUS
  Filled 2011-05-15 (×2): qty 0.4

## 2011-05-15 MED ORDER — ALBUTEROL SULFATE (5 MG/ML) 0.5% IN NEBU: 2.5000 mg | INHALATION_SOLUTION | RESPIRATORY_TRACT | Status: DC | PRN

## 2011-05-15 MED ORDER — LISINOPRIL 10 MG PO TABS: 10.0000 mg | ORAL_TABLET | Freq: Every day | ORAL | Status: DC

## 2011-05-15 MED ORDER — PREDNISONE 20 MG PO TABS: 40.0000 mg | ORAL_TABLET | Freq: Every day | ORAL | Status: DC

## 2011-05-15 NOTE — Progress Notes (Signed)
Subjective: Patient pleasantly confused.  No specific concerns.  Patient is uncertain if she vomited.  Objective: Vital signs in last 24 hours: Filed Vitals:   05/14/11 0905 05/14/11 1512 05/15/11 0100 05/15/11 0531  BP:  126/68 157/96 146/87  Pulse:  99 93 94  Temp:  98 F (36.7 C) 98.3 F (36.8 C) 98.2 F (36.8 C)  TempSrc:  Oral Oral Oral  Resp:  18 18 18   Height:      Weight:      SpO2: 91% 95% 92% 92%   Weight change:   Intake/Output Summary (Last 24 hours) at 05/15/11 1223 Last data filed at 05/15/11 1130  Gross per 24 hour  Intake   1680 ml  Output    841 ml  Net    839 ml    Physical Exam: General: Awake, No acute distress. HEENT: EOMI. Neck: Supple CV: S1 and S2 Lungs: Clear to ascultation bilaterally Abdomen: Soft, Nontender, Nondistended, +bowel sounds. Ext: Good pulses. Trace edema.  Lab Results:  [facility] 05/15/11 0525  NA 135  K 4.8  CL 98  CO2 25  GLUCOSE 211*  BUN 20  CREATININE 1.09  CALCIUM 10.1  MG --  PHOS --   No results found for this basename: AST:2,ALT:2,ALKPHOS:2,BILITOT:2,PROT:2,ALBUMIN:2 in the last 72 hours No results found for this basename: LIPASE:2,AMYLASE:2 in the last 72 hours  Basename 05/15/11 0525  WBC 4.0  NEUTROABS --  HGB 13.0  HCT 39.7  MCV 87.1  PLT 169   No results found for this basename: CKTOTAL:3,CKMB:3,CKMBINDEX:3,TROPONINI:3 in the last 72 hours No components found with this basename: POCBNP:3 No results found for this basename: DDIMER:2 in the last 72 hours No results found for this basename: HGBA1C:2 in the last 72 hours No results found for this basename: CHOL:2,HDL:2,LDLCALC:2,TRIG:2,CHOLHDL:2,LDLDIRECT:2 in the last 72 hours No results found for this basename: TSH,T4TOTAL,FREET3,T3FREE,THYROIDAB in the last 72 hours No results found for this basename: VITAMINB12:2,FOLATE:2,FERRITIN:2,TIBC:2,IRON:2,RETICCTPCT:2 in the last 72 hours  Micro Results: Recent Results (from the past 240 hour(s))    URINE CULTURE     Status: Normal   Collection Time   05/13/11  2:57 AM      Component Value Range Status Comment   Specimen Description URINE, CLEAN CATCH   Final    Special Requests Normal   Final    Setup Time [id number]   Final    Colony Count 40,000 COLONIES/ML   Final    Culture     Final    Value: Multiple bacterial morphotypes present, none predominant. Suggest appropriate recollection if clinically indicated.   Report Status 05/14/2011 FINAL   Final   CULTURE, BLOOD (ROUTINE X 2)     Status: Normal (Preliminary result)   Collection Time   05/13/11  3:00 AM      Component Value Range Status Comment   Specimen Description BLOOD IV   Final    Special Requests BOTTLES DRAWN AEROBIC AND ANAEROBIC 5 CC EA   Final    Setup Time [id number]   Final    Culture     Final    Value:        BLOOD CULTURE RECEIVED NO GROWTH TO DATE CULTURE WILL BE HELD FOR 5 DAYS BEFORE ISSUING A FINAL NEGATIVE REPORT   Report Status PENDING   Incomplete   CULTURE, BLOOD (ROUTINE X 2)     Status: Normal (Preliminary result)   Collection Time   05/13/11  3:06 AM      Component Value Range  Status Comment   Specimen Description BLOOD LEFT FOREARM   Final    Special Requests BOTTLES DRAWN AEROBIC ONLY 7 CC   Final    Setup Time [id number]   Final    Culture     Final    Value:        BLOOD CULTURE RECEIVED NO GROWTH TO DATE CULTURE WILL BE HELD FOR 5 DAYS BEFORE ISSUING A FINAL NEGATIVE REPORT   Report Status PENDING   Incomplete     Studies/Results: No results found.  Medications: I have reviewed the patient's current medications. Scheduled Meds:   . ARIPiprazole  5 mg Oral BH-qamhs  . clonazePAM  0.5 mg Oral BH-q8a2phs  . enoxaparin  40 mg Subcutaneous Q24H  . gabapentin  200 mg Oral BH-q8a2phs  . insulin aspart  0-9 Units Subcutaneous TID WC  . lisinopril  10 mg Oral Daily  . methylPREDNISolone (SOLU-MEDROL) injection  60 mg Intravenous Once  . nicotine  7 mg Transdermal Daily  . pantoprazole   40 mg Oral Q1200  . predniSONE  40 mg Oral QAC breakfast  . traZODone  150 mg Oral QHS   Continuous Infusions:  PRN Meds:.acetaminophen, albuterol, magnesium hydroxide, menthol-cetylpyridinium, ondansetron (ZOFRAN) IV, ondansetron, promethazine  Assessment/Plan: Nausea vomiting, diarrhea and abdominal pain secondary to pancreatitis/ serum lipase elevation/pancreatic pseudocyst.  The patient was admitted and put on a clear liquid diet and advanced as tolerated to a regular diet. PRN pain and nausea medications were provided. Unclear if symptoms were from pancreatitis or viral gastroenteritis.  Resolved with conservative management. We advanced her diet and 05/11/11, and she tolerated this without a return of her symptoms.  Patient will need outpatient followup with primary care physician for management of her pseudocyst area  Schizoaffective disorder, bipolar type  Continue psychotropic medications. Seen by Dr. Ferol Luz who recommended transfer back to [facility] when medically stable.  Cannabis abuse / Alcohol dependence / Substance abuse  Transfer back to [facility] for further treatment, she is medically stable.   Active smoker  Tobacco cessation counseling per nursing staff ordered.   Mental retardation  Likely to d/c to a group home/ALF after discharge from behavioral health.  Diabetes Mellitus  CBGs 114-171. Continue SSI. Does not appear that she was on medication prior to admission, so likely diet controlled. Her Hgb A1c was 6.4%. Steroids may cause a transient rise in her CBGs. Monitor.   Dizziness  H&H stable.   Sore Throat/Viral URI with bronchospasm  Cepacol lozenges. Likely viral. Add delsym. Supportive care with tylenol, PRN nebs. Will give her a dose of solumedrol now and start prednisone 40 mg today for a total of 3 days.   Disposition  Discharged to behavioral health if bed is available.   LOS: 8 days  Alecea Trego A, MD 05/15/2011, 12:23 PM

## 2011-05-15 NOTE — Discharge Summary (Signed)
Discharge Summary  [name] MR#: [id number]  DOB:[birth year]  Date of Admission: 05/07/2011 Date of Discharge: 05/15/2011  Patient's PCP: No primary provider on file.  Attending Physician:Paz Winsett A  Consults: Dr. Ferol Luz - Psychiatry  Discharge Diagnoses: Principal Problem:  *Nausea vomiting and diarrhea Active Problems:  Schizoaffective disorder, bipolar type  Cannabis abuse  Alcohol dependence  Substance abuse  Pancreatitis  Active smoker  Pseudocyst, pancreas  Abdominal pain, acute, epigastric  Mental retardation  Serum lipase elevation  DM (diabetes mellitus)  Dizziness  Sore throat  Viral URI  Bronchospasm  Brief Admitting History and Physical 56 year old [place] American female with history of bipolar disorder, schizoaffective disorder who presented on 05/10/2011 with abdominal pain, nausea and vomiting.  Discharge Medications Current Discharge Medication List    START taking these medications   Details  albuterol (PROVENTIL) (5 MG/ML) 0.5% nebulizer solution Take 0.5 mLs (2.5 mg total) by nebulization every 4 (four) hours as needed for wheezing or shortness of breath. Qty: 20 mL    clonazePAM (KLONOPIN) 0.5 MG tablet Take 1 tablet (0.5 mg total) by mouth 3 (three) times daily at 8am, 2pm and bedtime. Qty: 30 tablet    gabapentin (NEURONTIN) 100 MG capsule Take 2 capsules (200 mg total) by mouth 3 (three) times daily at 8am, 2pm and bedtime.    lisinopril (PRINIVIL,ZESTRIL) 10 MG tablet Take 1 tablet (10 mg total) by mouth daily.    menthol-cetylpyridinium (CEPACOL) 3 MG lozenge Take 1 lozenge (3 mg total) by mouth as needed. Qty: 100 tablet    nicotine (NICODERM CQ - DOSED IN MG/24 HR) 7 mg/24hr patch Place 1 patch onto the skin daily. Do not smoke while on the patch. Qty: 28 patch    predniSONE (DELTASONE) 20 MG tablet Take 2 tablets (40 mg total) by mouth daily before breakfast. Discontinue after 05/17/2011      CONTINUE these medications which  have NOT CHANGED   Details  ARIPiprazole (ABILIFY) 5 MG tablet Take 5 mg by mouth 2 (two) times daily.      traZODone (DESYREL) 150 MG tablet Take 150 mg by mouth at bedtime.       STOP taking these medications     acetaminophen (TYLENOL) 500 MG tablet      LORazepam (ATIVAN) 1 MG tablet         Hospital Course: Nausea vomiting, diarrhea and abdominal pain secondary to pancreatitis/ serum lipase elevation/pancreatic pseudocyst.  The patient was admitted and put on a clear liquid diet and advanced as tolerated to a regular diet. PRN pain and nausea medications were provided. Unclear if symptoms were from pancreatitis or viral gastroenteritis.  Resolved with conservative management. We advanced her diet and 05/11/11, and she tolerated this without a return of her symptoms.  Patient will need outpatient followup with primary care physician for management of her pseudocyst.  Schizoaffective disorder, bipolar type  Continue psychotropic medications. Seen by Dr. Ferol Luz who recommended transfer back to [facility] when medically stable.  Cannabis abuse / Alcohol dependence / Substance abuse  Transfer back to [facility] for further treatment, she is medically stable.   Active smoker  Tobacco cessation counseling per nursing staff ordered.   Mental retardation  Likely to d/c to a group home/ALF after discharge from behavioral health.  Diabetes Mellitus  CBGs 114-171. Continue SSI. Does not appear that she was on medication prior to admission, so likely diet controlled. Her Hgb A1c was 6.4%. Steroids may cause a transient rise in her CBGs. Monitor.   Dizziness  H&H stable.   Sore Throat/Viral URI with bronchospasm  Cepacol lozenges. Likely viral. Add delsym. Supportive care with tylenol, PRN nebs. Will give her a dose of solumedrol now and start prednisone 40 mg today for a total of 3 days.    Day of Discharge BP 146/87  Pulse 94  Temp(Src) 98.2 F (36.8 C) (Oral)  Resp 18  Ht 5\' 1"  (1.549 m)   Wt 65.318 kg (144 lb)  BMI 27.21 kg/m2  SpO2 92%  LMP 04/19/2011  Results for orders placed during the hospital encounter of 05/07/11 (from the past 48 hour(s))  GLUCOSE, CAPILLARY     Status: Abnormal   Collection Time   05/13/11  5:05 PM      Component Value Range Comment   Glucose-Capillary 114 (*) 70 - 99 (mg/dL)    Comment 1 Notify RN     GLUCOSE, CAPILLARY     Status: Abnormal   Collection Time   05/14/11  7:22 AM      Component Value Range Comment   Glucose-Capillary 121 (*) 70 - 99 (mg/dL)    Comment 1 Notify RN     GLUCOSE, CAPILLARY     Status: Abnormal   Collection Time   05/14/11 11:18 AM      Component Value Range Comment   Glucose-Capillary 126 (*) 70 - 99 (mg/dL)    Comment 1 Notify RN     GLUCOSE, CAPILLARY     Status: Abnormal   Collection Time   05/14/11  4:07 PM      Component Value Range Comment   Glucose-Capillary 142 (*) 70 - 99 (mg/dL)    Comment 1 Notify RN     GLUCOSE, CAPILLARY     Status: Abnormal   Collection Time   05/14/11  8:57 PM      Component Value Range Comment   Glucose-Capillary 229 (*) 70 - 99 (mg/dL)    Comment 1 Notify RN     BASIC METABOLIC PANEL     Status: Abnormal   Collection Time   05/15/11  5:25 AM      Component Value Range Comment   Sodium 135  135 - 145 (mEq/L)    Potassium 4.8  3.5 - 5.1 (mEq/L)    Chloride 98  96 - 112 (mEq/L)    CO2 25  19 - 32 (mEq/L)    Glucose, Bld 211 (*) 70 - 99 (mg/dL)    BUN 20  6 - 23 (mg/dL)    Creatinine, Ser 1.61  0.50 - 1.10 (mg/dL)    Calcium 09.6  8.4 - 10.5 (mg/dL)    GFR calc non Af Amer 56 (*) >90 (mL/min)    GFR calc Af Amer 65 (*) >90 (mL/min)   CBC     Status: Normal   Collection Time   05/15/11  5:25 AM      Component Value Range Comment   WBC 4.0  4.0 - 10.5 (K/uL)    RBC 4.56  3.87 - 5.11 (MIL/uL)    Hemoglobin 13.0  12.0 - 15.0 (g/dL)    HCT 04.5  40.9 - 81.1 (%)    MCV 87.1  78.0 - 100.0 (fL)    MCH 28.5  26.0 - 34.0 (pg)    MCHC 32.7  30.0 - 36.0 (g/dL)    RDW 91.4  78.2 -  95.6 (%)    Platelets 169  150 - 400 (K/uL)   GLUCOSE, CAPILLARY     Status: Abnormal   Collection  Time   05/15/11  7:31 AM      Component Value Range Comment   Glucose-Capillary 196 (*) 70 - 99 (mg/dL)    Comment 1 Notify RN     GLUCOSE, CAPILLARY     Status: Abnormal   Collection Time   05/15/11 11:29 AM      Component Value Range Comment   Glucose-Capillary 140 (*) 70 - 99 (mg/dL)    Comment 1 Notify RN       Mr Brain Wo Contrast  05/10/2011  *RADIOLOGY REPORT*  Clinical Data: Confusion and long history of alcohol abuse.  MRI HEAD WITHOUT CONTRAST  Technique:  Multiplanar, multiecho pulse sequences of the brain and surrounding structures were obtained according to standard protocol without intravenous contrast.  Comparison: None.  Findings: Minimal periventricular white matter change may be within normal limits for age.  No acute infarct, hemorrhage, or mass lesion is present.  The ventricles are of normal size.  No significant extra-axial fluid collection is present.  Note is made of hyperostosis frontalis internus.  Flow is present in the major intracranial arteries.  The globes and orbits are intact.  The paranasal sinuses and mastoid air cells are clear. No significant cerebral or cerebellar atrophy is evident.  IMPRESSION: Normal MRI of the brain for age.  Original Report Authenticated By: Jamesetta Orleans. MATTERN, M.D.   Ct Abdomen Pelvis W Contrast  05/10/2011  *RADIOLOGY REPORT*  Clinical Data: Nausea, vomiting, diarrhea, prior history of pancreatitis  CT ABDOMEN AND PELVIS WITH CONTRAST  Technique:  Multidetector CT imaging of the abdomen and pelvis was performed following the standard protocol during bolus administration of intravenous contrast.  Contrast: OMNIPAQUE IOHEXOL 300 MG/ML IV SOLN  Comparison: Abdomen films of 05/10/2011  Findings: Linear atelectasis or scarring is noted at the lung bases.  The liver enhances with no focal abnormality and no ductal dilatation is seen.  The  gallbladder is somewhat contracted and no calcified gallstones are noted.  The pancreas is unremarkable and the pancreatic duct is not dilated.  There is a small low attenuation structure near the tail of the pancreas which may represent a pseudocyst of 13 mm in diameter.  The adrenal glands and spleen are unremarkable.  The stomach is not distended.  The kidneys enhance with no definite abnormality.  There is low attenuation area in the upper pole of the left kidney which is indeterminate, better seen on coronal images and follow-up is recommended.  The abdominal aorta is normal in caliber.  No adenopathy is seen.  The uterus is normal in size.  No adnexal lesion is seen.  Only a tiny amount of free fluid is noted within the pelvis.  The urinary bladder is not well distended and is slightly thick-walled. Cystitis cannot be excluded.  Diverticula scattered diffusely throughout the colon but no diverticulitis is seen.  The terminal ileum is unremarkable.  No bony abnormality is seen.  IMPRESSION:  1.  No evidence of acute pancreatitis. 2.  Small low attenuation structure near the tail of the pancreas may represent a 13 mm pseudocyst. 3.  Small indeterminate lesion in the upper pole of the left kidney.  Consider follow-up. 4.  Multiple colonic diverticula diffusely. 5.  Somewhat thick-walled urinary bladder.  Cannot exclude cystitis.  Original Report Authenticated By: Juline Patch, M.D.   Dg Chest Port 1 View  05/13/2011  *RADIOLOGY REPORT*  Clinical Data: Fever, evaluate for aspiration  PORTABLE CHEST - 1 VIEW  Comparison: Acute abdominal radiographic  series - 05/10/2011; abdominal CT - 05/10/2011  Findings: Grossly unchanged cardiac silhouette and mediastinal contours.  No focal airspace opacities.  No pleural effusion or pneumothorax.  Grossly unchanged bones.  IMPRESSION: No acute cardiopulmonary disease.  Specifically, no evidence of pneumonia.  Original Report Authenticated By: Waynard Reeds, M.D.   Dg  Abd Acute W/chest  05/10/2011  *RADIOLOGY REPORT*  Clinical Data: Upper abdominal pain with nausea and vomiting.  ACUTE ABDOMEN SERIES (ABDOMEN 2 VIEW & CHEST 1 VIEW)  Comparison:  None.  Findings:  There is no evidence of dilated bowel loops or free intraperitoneal air.  No radiopaque calculi or other significant radiographic abnormality is seen. Heart size and mediastinal contours are within normal limits.  Both lungs are clear.  There is mild peribronchial thickening which could suggest chronic bronchitis.  There is no effusion or pneumothorax.  IMPRESSION: Negative abdominal radiographs.  No acute cardiopulmonary disease. Question chronic bronchitis.  Original Report Authenticated By: Elsie Stain, M.D.   Disposition: Behavioral health.  Diet: Diabetic diet.  Activity: Resume as tolerated.   Follow-up Appts: Followup with PCP in 1 week after discharge. Will need outpatient followup with PCP for management of patient's pancreatic pseudocyst.  TESTS THAT NEED FOLLOW-UP None  Time spent on discharge, talking to the patient, and coordinating care: 35 mins.  Signed: Cristal Ford, MD 05/15/2011, 12:31 PM

## 2011-05-15 NOTE — ED Notes (Signed)
Pt was sent to the ED from [facility] when pt was to be a direct admit. Pt has a bed assignment to 1510, per bed placement pts chart needs to be discharged and create a new ED . Dr. Darlys Gales has been notified that pts admission orders will have to be reordered. Junius Argyle with [facility] is aware of situation

## 2011-05-15 NOTE — ED Notes (Signed)
Pt d/c'd from inpatient here today, sent to [facility] for admission. On admission pt still presented with diarrhea so Dr. Darlys Gales direct admitted pt back here. Pt sent to ER instead so a new account was encountered per bed placedment

## 2011-05-15 NOTE — ED Notes (Signed)
Per [facility] pt was sent from [facility], pt was seen in ER for n/v/d, was unclear if cause was for pancreatitis or gastroenteritis, [facility] is concerned if pt has C diff. Pt is having diarrhea. No n/v at this time. No SI/HI at this time.

## 2011-05-15 NOTE — ED Notes (Signed)
Urine collected and sent down to lab for keeping. No order for testing at this time. RN notified 

## 2011-05-15 NOTE — Progress Notes (Signed)
Pt brought to [facility] from [facility] for admission. When writer went to see pt for admission, pt needed to be escorted to restroom immediately due to diarrhea. Forensic psychologist at [employer] and spoke with pt's nurse Easter. Per nurse pt had diarrhea before being transferred also. Alerted MD, took vitals and started admission. Per Dr Dan Humphreys stop admission and transfer pt back to [facility]. Called [facility] and alerted housekeeping. Pt being transferred back to [facility].Pt denied si and hi.

## 2011-05-15 NOTE — BH Assessment (Signed)
Assessment Note   [name] is an 56 y.o. female. Patient presented to emergency department on 05/06/11 with paranoia, agitation and inability to contract for safety. She was admitted to [facility]. On 05/11/2011 she developed significant diarrhea and vomiting and was transferred emergency services and was admitted to medical unit. Patient is now medically stable and is ready to be transferred back to [facility] for further psychiatric stabilization.  Axis I: Schizoaffective Disorder Axis II: No diagnosis Axis III:  Past Medical History  Diagnosis Date  . Diabetes mellitus   . Bipolar 1 disorder   . Schizoaffective disorder, bipolar type   . Hypertension    Axis IV: problems related to social environment Axis V: 31-40 impairment in reality testing  Past Medical History:  Past Medical History  Diagnosis Date  . Diabetes mellitus   . Bipolar 1 disorder   . Schizoaffective disorder, bipolar type   . Hypertension     Past Surgical History  Procedure Date  . Arm surgery     Family History: History reviewed. No pertinent family history.  Social History:  reports that she has been smoking Cigarettes.  She has a 5 pack-year smoking history. She has never used smokeless tobacco. She reports that she drinks alcohol. She reports that she uses illicit drugs (Marijuana).  Additional Social History:  Alcohol / Drug Use History of alcohol / drug use?: Yes Longest period of sobriety (when/how long): unknown Withdrawal Symptoms: Agitation Substance #1 Name of Substance 1: ETOH 1 - Age of First Use: unknown 1 - Amount (size/oz): unknown 1 - Frequency: unknown 1 - Duration: years 1 - Last Use / Amount: unknown Substance #2 Name of Substance 2: cannibus 2 - Age of First Use: unknown 2 - Amount (size/oz): unknown 2 - Frequency: unknown 2 - Duration: unknown 2 - Last Use / Amount: recent Allergies: No Known Allergies  Home Medications:  Medications Prior to Admission  Medication Dose Route  Frequency Provider Last Rate Last Dose  . acetaminophen (TYLENOL) tablet 650 mg  650 mg Oral Q6H PRN Franchot Gallo, MD   650 mg at 05/13/11 1505  . albuterol (PROVENTIL) (5 MG/ML) 0.5% nebulizer solution 2.5 mg  2.5 mg Nebulization Q4H PRN Gardner Candle Tharington, RRT   2.5 mg at 05/14/11 1742  . ARIPiprazole (ABILIFY) tablet 5 mg  5 mg Oral BH-qamhs Randy Readling, MD   5 mg at 05/15/11 0759  . clonazePAM (KLONOPIN) tablet 0.5 mg  0.5 mg Oral [address] [place], [place]   0.5 mg at 05/15/11 0759  . enoxaparin (LOVENOX) injection 40 mg  40 mg Subcutaneous Q24H Clanford Cyndie Mull, MD   40 mg at 05/14/11 2126  . fentaNYL (SUBLIMAZE) injection 50 mcg  50 mcg Intravenous Once [employer], PA   50 mcg at 05/10/11 0719  . gabapentin (NEURONTIN) capsule 200 mg  200 mg Oral [address] [place], [place]   200 mg at 05/15/11 4132  . haloperidol lactate (HALDOL) injection 5 mg  5 mg Intramuscular Once Hurman Horn, MD   5 mg at 05/06/11 4401  . haloperidol lactate (HALDOL) injection 5 mg  5 mg Intramuscular Once Joya Gaskins, MD   5 mg at 05/07/11 0449  . influenza  inactive virus vaccine (FLUZONE/FLUARIX) injection 0.5 mL  0.5 mL Intramuscular Tomorrow-1000 Randy Readling, MD      . insulin aspart (novoLOG) injection 0-9 Units  0-9 Units Subcutaneous TID WC Clanford Cyndie Mull, MD   2 Units at 05/15/11 0759  .  iohexol (OMNIPAQUE) 300 MG/ML solution 100 mL  100 mL Intravenous Once PRN Medication Radiologist   100 mL at 05/10/11 1120  . lisinopril (PRINIVIL,ZESTRIL) tablet 10 mg  10 mg Oral Daily Franchot Gallo, MD   10 mg at 05/15/11 0759  . magnesium hydroxide (MILK OF MAGNESIA) suspension 30 mL  30 mL Oral Daily PRN Franchot Gallo, MD      . menthol-cetylpyridinium (CEPACOL) lozenge 3 mg  1 lozenge Oral PRN Christina Rama      . methylPREDNISolone sodium succinate (SOLU-MEDROL) 125 MG injection 60 mg  60 mg Intravenous Once Christina Rama   60 mg at 05/14/11 1829  . nicotine  (NICODERM CQ - dosed in mg/24 hr) patch 7 mg  7 mg Transdermal Daily Christina Rama   7 mg at 05/15/11 0929  . ondansetron (ZOFRAN) injection 4 mg  4 mg Intravenous Once Jenness Corner, PA   4 mg at 05/10/11 0718  . ondansetron (ZOFRAN) tablet 4 mg  4 mg Oral Q6H PRN Clanford Cyndie Mull, MD       Or  . ondansetron (ZOFRAN) injection 4 mg  4 mg Intravenous Q6H PRN Clanford Cyndie Mull, MD   4 mg at 05/11/11 2326  . pantoprazole (PROTONIX) EC tablet 40 mg  40 mg Oral Q1200 Clanford Cyndie Mull, MD   40 mg at 05/15/11 1113  . predniSONE (DELTASONE) tablet 40 mg  40 mg Oral QAC breakfast Christina Rama   40 mg at 05/15/11 0827  . promethazine (PHENERGAN) injection 25 mg  25 mg Intravenous Q6H PRN Mary A. Lynch   12.5 mg at 05/13/11 0358  . sodium chloride 0.9 % bolus 1,000 mL  1,000 mL Intravenous Once Jenness Corner, PA   1,000 mL at 05/10/11 0717  . traZODone (DESYREL) tablet 150 mg  150 mg Oral QHS Franchot Gallo, MD   150 mg at 05/14/11 2123  . DISCONTD: 0.9 %  sodium chloride infusion   Intravenous Continuous Clanford Cyndie Mull, MD 75 mL/hr at 05/11/11 2130    . DISCONTD: acetaminophen (TYLENOL) tablet 1,000 mg  1,000 mg Oral BID PRN Grant Fontana, PA      . DISCONTD: albuterol (PROVENTIL) (5 MG/ML) 0.5% nebulizer solution 2.5 mg  2.5 mg Nebulization Q6H Clanford Cyndie Mull, MD      . DISCONTD: albuterol (PROVENTIL) (5 MG/ML) 0.5% nebulizer solution 2.5 mg  2.5 mg Nebulization TID Christina Rama   2.5 mg at 05/14/11 0903  . DISCONTD: alum & mag hydroxide-simeth (MAALOX/MYLANTA) 200-200-20 MG/5ML suspension 30 mL  30 mL Oral PRN Grant Fontana, PA      . DISCONTD: alum & mag hydroxide-simeth (MAALOX/MYLANTA) 200-200-20 MG/5ML suspension 30 mL  30 mL Oral Q4H PRN Franchot Gallo, MD      . DISCONTD: ARIPiprazole (ABILIFY) tablet 5 mg  5 mg Oral BID Grant Fontana, PA   5 mg at 05/06/11 2244  . DISCONTD: ARIPiprazole (ABILIFY) tablet 5 mg  5 mg Oral BID Franchot Gallo, MD      . DISCONTD:  clonazePAM (KLONOPIN) tablet 0.5 mg  0.5 mg Oral BH-[id number] Randy Readling, MD   0.5 mg at 05/10/11 1148  . DISCONTD: gabapentin (NEURONTIN) capsule 200 mg  200 mg Oral BH-[id number] Randy Readling, MD   200 mg at 05/10/11 1136  . DISCONTD: insulin aspart (novoLOG) injection 0-15 Units  0-15 Units Subcutaneous Q4H Carleene Cooper III, MD   2 Units at 05/07/11 0813  . DISCONTD: ipratropium (ATROVENT) nebulizer solution 0.5 mg  0.5 mg Nebulization  Q6H Clanford Cyndie Mull, MD      . DISCONTD: ipratropium (ATROVENT) nebulizer solution 0.5 mg  0.5 mg Nebulization TID Christina Rama   0.5 mg at 05/14/11 0903  . DISCONTD: LORazepam (ATIVAN) tablet 1 mg  1 mg Oral Q8H PRN Grant Fontana, PA   1 mg at 05/06/11 1028  . DISCONTD: LORazepam (ATIVAN) tablet 1 mg  1 mg Oral Q8H Randy Readling, MD      . DISCONTD: LORazepam (ATIVAN) tablet 1 mg  1 mg Oral BH-qamhs Randy Readling, MD      . DISCONTD: LORazepam (ATIVAN) tablet 1 mg  1 mg Oral BH-q8a2phs Franchot Gallo, MD   1 mg at 05/08/11 0816  . DISCONTD: metFORMIN (GLUCOPHAGE) tablet 500 mg  500 mg Oral Q breakfast Franchot Gallo, MD   500 mg at 05/08/11 0817  . DISCONTD: metFORMIN (GLUCOPHAGE) tablet 500 mg  500 mg Oral BID WC Franchot Gallo, MD   500 mg at 05/10/11 1136  . DISCONTD: morphine 2 MG/ML injection 1 mg  1 mg Intravenous Q3H PRN Clanford Cyndie Mull, MD      . DISCONTD: mulitivitamin with minerals tablet 1 tablet  1 tablet Oral Daily Franchot Gallo, MD   1 tablet at 05/10/11 1137  . DISCONTD: ondansetron (ZOFRAN) tablet 4 mg  4 mg Oral Q8H PRN Grant Fontana, [place]      . DISCONTD: thiamine (B-1) injection 100 mg  100 mg Intravenous Daily Franchot Gallo, MD   100 mg at 05/07/11 1306  . DISCONTD: thiamine (VITAMIN B-1) tablet 100 mg  100 mg Oral Daily Franchot Gallo, MD   100 mg at 05/10/11 1145  . DISCONTD: traZODone (DESYREL) tablet 150 mg  150 mg Oral QHS Grant Fontana, [place]   150 mg at 05/06/11 2244   Medications Prior to Admission  Medication  Sig Dispense Refill  . ARIPiprazole (ABILIFY) 5 MG tablet Take 5 mg by mouth 2 (two) times daily.        Marland Kitchen LORazepam (ATIVAN) 1 MG tablet Take 1 mg by mouth every 8 (eight) hours.        . traZODone (DESYREL) 150 MG tablet Take 150 mg by mouth at bedtime.         OB/GYN Status:  Patient's last menstrual period was 04/19/2011.  General Assessment Data Location of Assessment: [facility] Assessment Services Living Arrangements: Group Home Can pt return to current living arrangement?: No Admission Status: Involuntary Is patient capable of signing voluntary admission?: No Transfer from: Acute Hospital Referral Source: Medical Floor Inpatient  Education Status Highest grade of school patient has completed: graduated high school  Risk to self Suicidal Ideation: No Is patient at risk for suicide?: No Suicidal Plan?: No Access to Means: No What has been your use of drugs/alcohol within the last 12 months?: patient denied any use. Records show alcohol daily and canibus Previous Attempts/Gestures: No Triggers for Past Attempts: None known Intentional Self Injurious Behavior: None Family Suicide History: Unknown Recent stressful life event(s): Other (Comment) (Unknown) Persecutory voices/beliefs?: No Depression: No Substance abuse history and/or treatment for substance abuse?: Yes Suicide prevention information given to non-admitted patients: Not applicable  Risk to Others Homicidal Ideation: No Thoughts of Harm to Others: No Current Homicidal Intent: No Current Homicidal Plan: No Access to Homicidal Means: No History of harm to others?: No Assessment of Violence: None Noted Does patient have access to weapons?:  (Unknown) Criminal Charges Pending?:  (Unknown) Does patient have a court date:  (Unknown)  Psychosis Hallucinations: None  noted Delusions: Persecutory  Mental Status Report Appear/Hygiene:  (Not assessed) Eye Contact: Other (Comment) (Not assessed) Motor Activity: Unable  to assess Speech: Unable to assess Level of Consciousness: Unable to assess Mood:  (Not assessed) Affect: Unable to Assess Anxiety Level: None Thought Processes:  (Not assessed) Judgement: Other (Comment) (Not assessed) Orientation: Unable to assess Obsessive Compulsive Thoughts/Behaviors: None  Cognitive Functioning Concentration:  (Not assessed) Insight: Poor Impulse Control: Poor Sleep:  (Not assessed) Vegetative Symptoms: None  Prior Inpatient Therapy Prior Inpatient Therapy: Yes Prior Therapy Dates:  (unknown) Prior Therapy Facilty/Provider(s):  (Unknown) Reason for Treatment:  (psychosis)     ADL Screening (condition at time of admission) Patient's cognitive ability adequate to safely complete daily activities?: No Patient able to express need for assistance with ADLs?: Yes Independently performs ADLs?: Yes Weakness of Legs: None Weakness of Arms/Hands: None  Home Assistive Devices/Equipment Home Assistive Devices/Equipment: None  Therapy Consults (therapy consults require a physician order) PT Evaluation Needed: No OT Evalulation Needed: No SLP Evaluation Needed: No Abuse/Neglect Assessment (Assessment to be complete while patient is alone) Physical Abuse: Denies Verbal Abuse: Denies Sexual Abuse: Denies Exploitation of patient/patient's resources: Denies Self-Neglect: Denies Values / Beliefs Cultural Requests During Hospitalization: None Spiritual Requests During Hospitalization: None Consults Spiritual Care Consult Needed: No Social Work Consult Needed: No Merchant navy officer (For Healthcare) Advance Directive: Patient does not have advance directive Pre-existing out of facility DNR order (yellow form or pink MOST form): No Nutrition Screen Diet: Regular Unintentional weight loss greater than 10lbs within the last month: No Dysphagia: No Home Tube Feeding or Total Parenteral Nutrition (TPN): No Patient appears severely malnourished: No Pregnant or  Lactating: No Dietitian Consult Needed: No  Additional Information CIRT Risk: No Elopement Risk: No Does patient have medical clearance?: Yes     Disposition:  Disposition Disposition of Patient: Inpatient treatment program Type of inpatient treatment program: Adult  On Site Evaluation by:   Reviewed with Physician:     Lavonia Dana 05/15/2011 11:30 AM

## 2011-05-15 NOTE — ED Notes (Signed)
EAV:[id number]> Expected date:05/15/11<BR> Expected time: 3:35 PM<BR> Means of arrival:Ambulance<BR> Comments:<BR> transfere from [facility]

## 2011-05-15 NOTE — Progress Notes (Signed)
Per [facility], bed ready.  Notified RN, MD and Pt.  Pt signed consents.  Faxed consents.  Pt to be d/c'd today.  Providence Crosby, LCSWA Clinical Social Work [phone]

## 2011-05-15 NOTE — H&P (Signed)
Patient's PCP: No primary provider on file.  Chief Complaint: Diarrhea  History of Present Illness: [name] is a 56 y.o. African American female with history of diabetes, bipolar disorder, schizoaffective disorder, hypertension, pancreatitis, history of mental retardation who presents with the above complaints.  Patient was discharged to behavioral health after being treated for her nausea, vomiting and diarrhea which was presumed to be from pancreatitis versus viral gastroenteritis.  Symptoms improved during the course of hospital stay.  After discharge at behavioral health the patient was noted to have significant diarrhea and had "seepage" as per Dr. Dan Humphreys, psychiatry.  Given that they do not have any isolation beds at behavioral health the patient was brought back to the [facility] emergency department for further evaluation.  Patient given her history of schizoaffective disorder is not a good historian.  Patient does report having diarrhea after going to behavioral health, she did report that it was watery.  She is uncertain whether she saw blood in her stools.  Patient denies any dizziness.  Denies any fevers, chills, nausea, vomiting, chest pain, shortness of breath, headaches or vision changes.  Patient did report that she has diarrhea whenever she drinks Diet Coke and she attributes her diarrhea to drinking diet coke.  Meds: Scheduled Meds:   Continuous Infusions:   PRN Meds:.   Allergies: Review of patient's allergies indicates no known allergies. Past Medical History  Diagnosis Date  . Diabetes mellitus   . Bipolar 1 disorder   . Schizoaffective disorder, bipolar type   . Hypertension   . MR (mental retardation)   . Pancreatitis    Past Surgical History  Procedure Date  . Arm surgery    No family history on file. History   Social History  . Marital Status: Married    Spouse Name: N/A    Number of Children: N/A  . Years of Education: N/A   Occupational History    . Not on file.   Social History Main Topics  . Smoking status: Current Everyday Smoker -- 1.0 packs/day for 5 years    Types: Cigarettes  . Smokeless tobacco: Never Used  . Alcohol Use: 0.0 oz/week    2-5 Glasses of wine per week     unknown/pt confused  . Drug Use: Yes    Special: Marijuana  . Sexually Active: No   Other Topics Concern  . Not on file   Social History Narrative  . No narrative on file   Review of Systems: All systems reviewed with the patient and positive as per history of present illness, otherwise all other systems are negative.  Physical Exam: Blood pressure 136/72, pulse 84, temperature 98.3 F (36.8 C), temperature source Oral, resp. rate 20, last menstrual period 04/19/2011, SpO2 92.00%. General: Awake, Oriented x3, No acute distress. HEENT: EOMI, Moist mucous membranes Neck: Supple CV: S1 and S2 Lungs: Clear to ascultation bilaterally Abdomen: Soft, Nontender, Nondistended, +bowel sounds. Ext: Good pulses. Trace edema. No clubbing or cyanosis noted. Neuro: Cranial Nerves II-XII grossly intact. Has 5/5 motor strength in upper and lower extremities.   Lab results:  [facility] 05/15/11 0525  NA 135  K 4.8  CL 98  CO2 25  GLUCOSE 211*  BUN 20  CREATININE 1.09  CALCIUM 10.1  MG --  PHOS --   No results found for this basename: AST:2,ALT:2,ALKPHOS:2,BILITOT:2,PROT:2,ALBUMIN:2 in the last 72 hours No results found for this basename: LIPASE:2,AMYLASE:2 in the last 72 hours  Basename 05/15/11 0525  WBC 4.0  NEUTROABS --  HGB 13.0  HCT 39.7  MCV 87.1  PLT 169   No results found for this basename: CKTOTAL:3,CKMB:3,CKMBINDEX:3,TROPONINI:3 in the last 72 hours No components found with this basename: POCBNP:3 No results found for this basename: DDIMER in the last 72 hours No results found for this basename: HGBA1C:2 in the last 72 hours No results found for this basename: CHOL:2,HDL:2,LDLCALC:2,TRIG:2,CHOLHDL:2,LDLDIRECT:2 in the last 72  hours No results found for this basename: TSH,T4TOTAL,FREET3,T3FREE,THYROIDAB in the last 72 hours No results found for this basename: VITAMINB12:2,FOLATE:2,FERRITIN:2,TIBC:2,IRON:2,RETICCTPCT:2 in the last 72 hours Imaging results:  Mr Brain Wo Contrast  05/10/2011  *RADIOLOGY REPORT*  Clinical Data: Confusion and long history of alcohol abuse.  MRI HEAD WITHOUT CONTRAST  Technique:  Multiplanar, multiecho pulse sequences of the brain and surrounding structures were obtained according to standard protocol without intravenous contrast.  Comparison: None.  Findings: Minimal periventricular white matter change may be within normal limits for age.  No acute infarct, hemorrhage, or mass lesion is present.  The ventricles are of normal size.  No significant extra-axial fluid collection is present.  Note is made of hyperostosis frontalis internus.  Flow is present in the major intracranial arteries.  The globes and orbits are intact.  The paranasal sinuses and mastoid air cells are clear. No significant cerebral or cerebellar atrophy is evident.  IMPRESSION: Normal MRI of the brain for age.  Original Report Authenticated By: Jamesetta Orleans. MATTERN, M.D.   Ct Abdomen Pelvis W Contrast  05/10/2011  *RADIOLOGY REPORT*  Clinical Data: Nausea, vomiting, diarrhea, prior history of pancreatitis  CT ABDOMEN AND PELVIS WITH CONTRAST  Technique:  Multidetector CT imaging of the abdomen and pelvis was performed following the standard protocol during bolus administration of intravenous contrast.  Contrast: OMNIPAQUE IOHEXOL 300 MG/ML IV SOLN  Comparison: Abdomen films of 05/10/2011  Findings: Linear atelectasis or scarring is noted at the lung bases.  The liver enhances with no focal abnormality and no ductal dilatation is seen.  The gallbladder is somewhat contracted and no calcified gallstones are noted.  The pancreas is unremarkable and the pancreatic duct is not dilated.  There is a small low attenuation structure  near the tail of the pancreas which may represent a pseudocyst of 13 mm in diameter.  The adrenal glands and spleen are unremarkable.  The stomach is not distended.  The kidneys enhance with no definite abnormality.  There is low attenuation area in the upper pole of the left kidney which is indeterminate, better seen on coronal images and follow-up is recommended.  The abdominal aorta is normal in caliber.  No adenopathy is seen.  The uterus is normal in size.  No adnexal lesion is seen.  Only a tiny amount of free fluid is noted within the pelvis.  The urinary bladder is not well distended and is slightly thick-walled. Cystitis cannot be excluded.  Diverticula scattered diffusely throughout the colon but no diverticulitis is seen.  The terminal ileum is unremarkable.  No bony abnormality is seen.  IMPRESSION:  1.  No evidence of acute pancreatitis. 2.  Small low attenuation structure near the tail of the pancreas may represent a 13 mm pseudocyst. 3.  Small indeterminate lesion in the upper pole of the left kidney.  Consider follow-up. 4.  Multiple colonic diverticula diffusely. 5.  Somewhat thick-walled urinary bladder.  Cannot exclude cystitis.  Original Report Authenticated By: Juline Patch, M.D.   Dg Chest Port 1 View  05/13/2011  *RADIOLOGY REPORT*  Clinical Data: Fever, evaluate for  aspiration  PORTABLE CHEST - 1 VIEW  Comparison: Acute abdominal radiographic series - 05/10/2011; abdominal CT - 05/10/2011  Findings: Grossly unchanged cardiac silhouette and mediastinal contours.  No focal airspace opacities.  No pleural effusion or pneumothorax.  Grossly unchanged bones.  IMPRESSION: No acute cardiopulmonary disease.  Specifically, no evidence of pneumonia.  Original Report Authenticated By: Waynard Reeds, M.D.   Dg Abd Acute W/chest  05/10/2011  *RADIOLOGY REPORT*  Clinical Data: Upper abdominal pain with nausea and vomiting.  ACUTE ABDOMEN SERIES (ABDOMEN 2 VIEW & CHEST 1 VIEW)  Comparison:  None.   Findings:  There is no evidence of dilated bowel loops or free intraperitoneal air.  No radiopaque calculi or other significant radiographic abnormality is seen. Heart size and mediastinal contours are within normal limits.  Both lungs are clear.  There is mild peribronchial thickening which could suggest chronic bronchitis.  There is no effusion or pneumothorax.  IMPRESSION: Negative abdominal radiographs.  No acute cardiopulmonary disease. Question chronic bronchitis.  Original Report Authenticated By: Elsie Stain, M.D.    Assessment & Plan by Problem: Recurrent diarrhea but now no nausea or vomiting.  Patient has no abdominal pain.  Etiology of diarrhea unclear at this time.  Patient recently treated for pancreatitis.  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  Currently the patient is on a diabetic diet and was tolerating it well prior to discharge.  The patient has not had any antibiotics recently to suggest C. difficile colitis.  However given patient's exposure in the hospital we'll sent for C. difficile colitis.  Uncertain if the patient has any lactose intolerance or questionable hypersensitivity to sodas?  Will also send for stool culture.  Schizoaffective disorder, bipolar type  Continue psychotropic medications. Reconsult Dr. Ferol Luz.  Cannabis abuse / Alcohol dependence / Substance abuse  Stable.   Active smoker  Tobacco cessation counseling per nursing staff ordered.   Mental retardation  Likely to d/c to a group home/ALF after completing hospitalization.   Diabetes Mellitus  CBGs 114-171. Continue SSI. Does not appear that she was on medication prior to admission, so likely diet controlled. Her Hgb A1c was 6.4%. Steroids may cause a transient rise in her CBGs. Monitor.   Dizziness  H&H stable.   Sore Throat/Viral URI with bronchospasm  Cepacol lozenges. Likely viral. Supportive care with tylenol, PRN nebs. Prednisone 40 mg today for a total of 3 days.   Code  status Full code.  Time spent on admission, talking to the patient, and coordinating care was: 60 mins.  Santiana Glidden A, MD 05/15/2011, 6:36 PM

## 2011-05-15 NOTE — Progress Notes (Signed)
Pt discharged to [facility]. Reports called to [name], RN on the unit. Pt. Left this  unit in stable condition, accompanied by staff and security.

## 2011-05-16 ENCOUNTER — Inpatient Hospital Stay (HOSPITAL_COMMUNITY)
Admission: AD | Admit: 2011-05-16 | Discharge: 2011-05-24 | DRG: 885 | Disposition: A | Discharge status: Home / Self Care | Payer: Medicaid Other | Source: Ambulatory Visit | Attending: Psychiatry | Admitting: Psychiatry

## 2011-05-16 ENCOUNTER — Encounter (HOSPITAL_COMMUNITY): Payer: Self-pay | Admitting: Physician Assistant

## 2011-05-16 DIAGNOSIS — J069 Acute upper respiratory infection, unspecified: Secondary | ICD-10-CM

## 2011-05-16 DIAGNOSIS — Z5900 Homelessness unspecified: Secondary | ICD-10-CM

## 2011-05-16 DIAGNOSIS — F411 Generalized anxiety disorder: Secondary | ICD-10-CM

## 2011-05-16 DIAGNOSIS — F1020 Alcohol dependence, uncomplicated: Secondary | ICD-10-CM | POA: Diagnosis present

## 2011-05-16 DIAGNOSIS — F319 Bipolar disorder, unspecified: Secondary | ICD-10-CM

## 2011-05-16 DIAGNOSIS — F1210 Cannabis abuse, uncomplicated: Secondary | ICD-10-CM | POA: Diagnosis present

## 2011-05-16 DIAGNOSIS — Z79899 Other long term (current) drug therapy: Secondary | ICD-10-CM

## 2011-05-16 DIAGNOSIS — F259 Schizoaffective disorder, unspecified: Principal | ICD-10-CM

## 2011-05-16 DIAGNOSIS — F79 Unspecified intellectual disabilities: Secondary | ICD-10-CM

## 2011-05-16 DIAGNOSIS — R197 Diarrhea, unspecified: Secondary | ICD-10-CM

## 2011-05-16 DIAGNOSIS — R1013 Epigastric pain: Secondary | ICD-10-CM

## 2011-05-16 DIAGNOSIS — E119 Type 2 diabetes mellitus without complications: Secondary | ICD-10-CM

## 2011-05-16 DIAGNOSIS — F1010 Alcohol abuse, uncomplicated: Secondary | ICD-10-CM

## 2011-05-16 DIAGNOSIS — F250 Schizoaffective disorder, bipolar type: Secondary | ICD-10-CM | POA: Diagnosis present

## 2011-05-16 LAB — COMPREHENSIVE METABOLIC PANEL
AST: 39 U/L — ABNORMAL HIGH (ref 0–37)
Albumin: 3.6 g/dL (ref 3.5–5.2)
Alkaline Phosphatase: 66 U/L (ref 39–117)
BUN: 19 mg/dL (ref 6–23)
Chloride: 101 mEq/L (ref 96–112)
Potassium: 4.1 mEq/L (ref 3.5–5.1)
Total Bilirubin: 0.4 mg/dL (ref 0.3–1.2)

## 2011-05-16 LAB — CBC
HCT: 39.3 % (ref 36.0–46.0)
Platelets: 195 10*3/uL (ref 150–400)
RBC: 4.59 MIL/uL (ref 3.87–5.11)
RDW: 12.9 % (ref 11.5–15.5)
WBC: 9.1 10*3/uL (ref 4.0–10.5)

## 2011-05-16 LAB — GLUCOSE, CAPILLARY

## 2011-05-16 LAB — CLOSTRIDIUM DIFFICILE BY PCR: Toxigenic C. Difficile by PCR: NEGATIVE

## 2011-05-16 MED ORDER — LISINOPRIL 10 MG PO TABS
10.0000 mg | ORAL_TABLET | Freq: Every day | ORAL | Status: DC
  Administered 2011-05-17: 10 mg via ORAL
  Filled 2011-05-16: qty 1

## 2011-05-16 MED ORDER — TETANUS-DIPHTH-ACELL PERTUSSIS 5-2.5-18.5 LF-MCG/0.5 IM SUSP
0.5000 mL | Freq: Once | INTRAMUSCULAR | Status: AC
  Administered 2011-05-16: 0.5 mL via INTRAMUSCULAR
  Filled 2011-05-16: qty 0.5

## 2011-05-16 MED ORDER — ARIPIPRAZOLE 5 MG PO TABS
5.0000 mg | ORAL_TABLET | Freq: Two times a day (BID) | ORAL | Status: DC
  Administered 2011-05-16: 5 mg via ORAL
  Filled 2011-05-16 (×2): qty 1

## 2011-05-16 MED ORDER — NICOTINE 7 MG/24HR TD PT24
7.0000 mg | MEDICATED_PATCH | Freq: Every day | TRANSDERMAL | Status: DC
  Administered 2011-05-17 – 2011-05-24 (×8): 7 mg via TRANSDERMAL
  Filled 2011-05-16 (×9): qty 1

## 2011-05-16 MED ORDER — CLONAZEPAM 0.5 MG PO TABS
0.5000 mg | ORAL_TABLET | ORAL | Status: DC
  Administered 2011-05-16 – 2011-05-24 (×23): 0.5 mg via ORAL
  Filled 2011-05-16 (×23): qty 1

## 2011-05-16 MED ORDER — ALBUTEROL SULFATE (5 MG/ML) 0.5% IN NEBU: 2.5000 mg | INHALATION_SOLUTION | RESPIRATORY_TRACT | Status: DC | PRN

## 2011-05-16 MED ORDER — MENTHOL 3 MG MT LOZG
1.0000 | LOZENGE | OROMUCOSAL | Status: DC | PRN
  Filled 2011-05-16: qty 9

## 2011-05-16 MED ORDER — ARIPIPRAZOLE 5 MG PO TABS
5.0000 mg | ORAL_TABLET | Freq: Two times a day (BID) | ORAL | Status: DC
Start: 2011-05-16 — End: 2011-05-17
  Administered 2011-05-16 – 2011-05-17 (×2): 5 mg via ORAL
  Filled 2011-05-16 (×3): qty 1

## 2011-05-16 MED ORDER — PREDNISONE 20 MG PO TABS: 40.0000 mg | ORAL_TABLET | Freq: Every day | ORAL | Status: DC

## 2011-05-16 MED ORDER — LISINOPRIL 10 MG PO TABS
10.0000 mg | ORAL_TABLET | Freq: Every day | ORAL | Status: DC
  Administered 2011-05-16: 10 mg via ORAL
  Filled 2011-05-16: qty 1

## 2011-05-16 MED ORDER — TUBERCULIN PPD 5 UNIT/0.1ML ID SOLN
5.0000 [IU] | Freq: Once | INTRADERMAL | Status: AC
  Administered 2011-05-16: 5 [IU] via INTRADERMAL
  Filled 2011-05-16: qty 0.1

## 2011-05-16 MED ORDER — TRAZODONE HCL 150 MG PO TABS
150.0000 mg | ORAL_TABLET | Freq: Every day | ORAL | Status: DC
  Filled 2011-05-16: qty 1

## 2011-05-16 MED ORDER — TRAZODONE HCL 150 MG PO TABS
150.0000 mg | ORAL_TABLET | Freq: Every day | ORAL | Status: DC
  Administered 2011-05-16 – 2011-05-23 (×8): 150 mg via ORAL
  Filled 2011-05-16 (×4): qty 1
  Filled 2011-05-16: qty 3
  Filled 2011-05-16 (×6): qty 1

## 2011-05-16 MED ORDER — PREDNISONE 20 MG PO TABS
40.0000 mg | ORAL_TABLET | Freq: Every day | ORAL | Status: DC
  Administered 2011-05-17: 40 mg via ORAL
  Filled 2011-05-16 (×2): qty 2
  Filled 2011-05-16: qty 4

## 2011-05-16 MED ORDER — GABAPENTIN 100 MG PO CAPS
200.0000 mg | ORAL_CAPSULE | ORAL | Status: DC
  Administered 2011-05-16: 200 mg via ORAL
  Filled 2011-05-16 (×2): qty 2

## 2011-05-16 MED ORDER — MENTHOL 3 MG MT LOZG: 1.0000 | LOZENGE | OROMUCOSAL | Status: DC | PRN

## 2011-05-16 MED ORDER — NICOTINE 7 MG/24HR TD PT24
7.0000 mg | MEDICATED_PATCH | Freq: Every day | TRANSDERMAL | Status: DC
  Administered 2011-05-16: 7 mg via TRANSDERMAL
  Filled 2011-05-16: qty 1

## 2011-05-16 MED ORDER — GABAPENTIN 100 MG PO CAPS
200.0000 mg | ORAL_CAPSULE | ORAL | Status: DC
  Administered 2011-05-16 – 2011-05-20 (×12): 200 mg via ORAL
  Filled 2011-05-16 (×16): qty 2

## 2011-05-16 MED ORDER — ALUM & MAG HYDROXIDE-SIMETH 200-200-20 MG/5ML PO SUSP
30.0000 mL | ORAL | Status: DC | PRN
  Administered 2011-05-18 – 2011-05-22 (×3): 30 mL via ORAL

## 2011-05-16 MED ORDER — MAGNESIUM HYDROXIDE 400 MG/5ML PO SUSP: 30.0000 mL | Freq: Every day | ORAL | Status: DC | PRN

## 2011-05-16 MED ORDER — ACETAMINOPHEN 325 MG PO TABS
650.0000 mg | ORAL_TABLET | Freq: Four times a day (QID) | ORAL | Status: DC | PRN
Start: 2011-05-16 — End: 2011-05-24
  Administered 2011-05-17 – 2011-05-24 (×13): 650 mg via ORAL

## 2011-05-16 MED ORDER — CLONAZEPAM 0.5 MG PO TABS
0.5000 mg | ORAL_TABLET | ORAL | Status: DC
  Administered 2011-05-16: 0.5 mg via ORAL
  Filled 2011-05-16: qty 1

## 2011-05-16 NOTE — Progress Notes (Signed)
Patient ID: [name], female   DOB: 13-May-1955, 56 y.o.   MRN: [id number] Pt admitted on involuntary basis, pt presents at this time and appears guarded and some paranoia seen during interactions. Pt also appears to be responding to internal stimuli, however denies any auditory/visual hallucinations. Pt also appears confused, not oriented to time and place. Pt has stated that she was unsure as to why she was here, pt denied any drug use and stated that she only smokes Kool cigarettes, pt stated that when she gets discharged from here she is going to go live in [place] or go live with a pastor that she indicated as an emergency contact,  Pt also stated that she has taken all of her medications as she is suppose to and stated that the ones for her mental condition are working just fine, pt able to contract for safety on admission

## 2011-05-16 NOTE — BH Assessment (Signed)
Assessment Note   [name] is an 56 y.o. female with a lengthy history of BiPolar and Schizoaffective Disorder.  Pt was brought in by [employer] after daughter alledgedly kicked pt out of the home.  Pt has refused her medication for over a week and is abusing ETOH and Cannibus.  Pt is now paranoid, agitated, and unable to contract for safety. Pt is not oriented and has been placed on IVC.  Pt is eating and drinking, but was dancing and becoming agitated in her room earlier and was given Geodon.  When assessed, pt was resting quietly in her room.  Pt denies SI or HI.  Pt is a poor historian regarding past hx of treatment though currently seen, is not compliant.   Pt is in need of inpatient treatment to address current symptoms.  Please run.  Axis I: Bipolar, mixed and Schizoaffective Disorder Axis II: Deferred Axis III:  Past Medical History  Diagnosis Date  . Diabetes mellitus   . Bipolar 1 disorder   . Schizoaffective disorder, bipolar type   . Hypertension   . MR (mental retardation)   . Pancreatitis    Axis IV: other psychosocial or environmental problems, problems related to social environment and problems with primary support group Axis V: 21-30 behavior considerably influenced by delusions or hallucinations OR serious impairment in judgment, communication OR inability to function in almost all areas  Past Medical History:  Past Medical History  Diagnosis Date  . Diabetes mellitus   . Bipolar 1 disorder   . Schizoaffective disorder, bipolar type   . Hypertension   . MR (mental retardation)   . Pancreatitis     Past Surgical History  Procedure Date  . Arm surgery     Family History: No family history on file.  Social History:  reports that she has been smoking Cigarettes.  She has a 5 pack-year smoking history. She has never used smokeless tobacco. She reports that she drinks alcohol. She reports that she uses illicit drugs (Marijuana).  Additional Social History:      Allergies: No Known Allergies  Home Medications:  Medications Prior to Admission  Medication Dose Route Frequency Provider Last Rate Last Dose  . acetaminophen (TYLENOL) tablet 650 mg  650 mg Oral Q6H PRN Srikar A Reddy   650 mg at 05/16/11 0847   Or  . acetaminophen (TYLENOL) suppository 650 mg  650 mg Rectal Q6H PRN Srikar A Reddy      . albuterol (PROVENTIL) (5 MG/ML) 0.5% nebulizer solution 2.5 mg  2.5 mg Nebulization Q4H PRN Srikar A Reddy      . ARIPiprazole (ABILIFY) tablet 5 mg  5 mg Oral BID Srikar A Reddy   5 mg at 05/16/11 1430  . clonazePAM (KLONOPIN) tablet 0.5 mg  0.5 mg Oral BH-[id number] Srikar A Reddy   0.5 mg at 05/16/11 1514  . enoxaparin (LOVENOX) injection 40 mg  40 mg Subcutaneous Q24H Srikar A Reddy   40 mg at 05/15/11 2115  . gabapentin (NEURONTIN) capsule 200 mg  200 mg Oral BH-[id number] Srikar A Reddy   200 mg at 05/16/11 1431  . insulin aspart (novoLOG) injection 0-15 Units  0-15 Units Subcutaneous TID WC Srikar A Reddy   2 Units at 05/16/11 1230  . lisinopril (PRINIVIL,ZESTRIL) tablet 10 mg  10 mg Oral Daily Srikar A Reddy   10 mg at 05/16/11 1430  . menthol-cetylpyridinium (CEPACOL) lozenge 3 mg  1 lozenge Oral PRN Srikar A Reddy      .  methylPREDNISolone sodium succinate (SOLU-MEDROL) 125 MG injection 60 mg  60 mg Intravenous Once Christina Rama   60 mg at 05/14/11 1829  . nicotine (NICODERM CQ - dosed in mg/24 hr) patch 7 mg  7 mg Transdermal Daily Srikar A Reddy   7 mg at 05/16/11 1430  . predniSONE (DELTASONE) tablet 40 mg  40 mg Oral QAC breakfast Srikar A Reddy      . TDaP (BOOSTRIX) injection 0.5 mL  0.5 mL Intramuscular Once Srikar A Reddy   0.5 mL at 05/16/11 1449  . traZODone (DESYREL) tablet 150 mg  150 mg Oral QHS Srikar A Reddy      . tuberculin injection 5 Units  5 Units Intradermal Once Srikar A Reddy   5 Units at 05/16/11 1448  . DISCONTD: acetaminophen (TYLENOL) tablet 650 mg  650 mg Oral Q6H PRN Franchot Gallo, MD   650 mg at 05/13/11 1505  .  DISCONTD: albuterol (PROVENTIL) (5 MG/ML) 0.5% nebulizer solution 2.5 mg  2.5 mg Nebulization Q4H PRN Gardner Candle Tharington, RRT   2.5 mg at 05/14/11 1742  . DISCONTD: albuterol (PROVENTIL) (5 MG/ML) 0.5% nebulizer solution 2.5 mg  2.5 mg Nebulization TID Christina Rama   2.5 mg at 05/14/11 0903  . DISCONTD: ARIPiprazole (ABILIFY) tablet 5 mg  5 mg Oral BH-qamhs Randy Readling, MD   5 mg at 05/15/11 0759  . DISCONTD: clonazePAM (KLONOPIN) tablet 0.5 mg  0.5 mg Oral [address] [place], [place]   0.5 mg at 05/15/11 0759  . DISCONTD: enoxaparin (LOVENOX) injection 40 mg  40 mg Subcutaneous Q24H Clanford Cyndie Mull, MD   40 mg at 05/14/11 2126  . DISCONTD: gabapentin (NEURONTIN) capsule 200 mg  200 mg Oral [address] [place], [place]   200 mg at 05/15/11 0759  . DISCONTD: insulin aspart (novoLOG) injection 0-9 Units  0-9 Units Subcutaneous TID WC Clanford Cyndie Mull, MD   1 Units at 05/15/11 1225  . DISCONTD: ipratropium (ATROVENT) nebulizer solution 0.5 mg  0.5 mg Nebulization TID Christina Rama   0.5 mg at 05/14/11 0903  . DISCONTD: lisinopril (PRINIVIL,ZESTRIL) tablet 10 mg  10 mg Oral Daily Franchot Gallo, MD   10 mg at 05/15/11 0759  . DISCONTD: magnesium hydroxide (MILK OF MAGNESIA) suspension 30 mL  30 mL Oral Daily PRN Franchot Gallo, MD      . DISCONTD: menthol-cetylpyridinium (CEPACOL) lozenge 3 mg  1 lozenge Oral PRN Christina Rama      . DISCONTD: nicotine (NICODERM CQ - dosed in mg/24 hr) patch 7 mg  7 mg Transdermal Daily Christina Rama   7 mg at 05/15/11 0929  . DISCONTD: ondansetron (ZOFRAN) injection 4 mg  4 mg Intravenous Q6H PRN Clanford Cyndie Mull, MD   4 mg at 05/11/11 2326  . DISCONTD: ondansetron (ZOFRAN) tablet 4 mg  4 mg Oral Q6H PRN Clanford Cyndie Mull, MD      . DISCONTD: pantoprazole (PROTONIX) EC tablet 40 mg  40 mg Oral Q1200 Clanford Cyndie Mull, MD   40 mg at 05/15/11 1113  . DISCONTD: predniSONE (DELTASONE) tablet 40 mg  40 mg Oral QAC breakfast  Christina Rama   40 mg at 05/15/11 0827  . DISCONTD: promethazine (PHENERGAN) injection 25 mg  25 mg Intravenous Q6H PRN Mary A. Lynch   12.5 mg at 05/13/11 0358  . DISCONTD: traZODone (DESYREL) tablet 150 mg  150 mg Oral QHS Franchot Gallo, MD   150 mg at 05/14/11 2123   Medications Prior to Admission  Medication Sig Dispense Refill  . albuterol (PROVENTIL) (5 MG/ML) 0.5% nebulizer solution Take 0.5 mLs (2.5 mg total) by nebulization every 4 (four) hours as needed for wheezing or shortness of breath.  20 mL    . ARIPiprazole (ABILIFY) 5 MG tablet Take 5 mg by mouth 2 (two) times daily.        . clonazePAM (KLONOPIN) 0.5 MG tablet Take 1 tablet (0.5 mg total) by mouth 3 (three) times daily at 8am, 2pm and bedtime.  30 tablet    . gabapentin (NEURONTIN) 100 MG capsule Take 2 capsules (200 mg total) by mouth 3 (three) times daily at 8am, 2pm and bedtime.      Marland Kitchen lisinopril (PRINIVIL,ZESTRIL) 10 MG tablet Take 1 tablet (10 mg total) by mouth daily.      Marland Kitchen menthol-cetylpyridinium (CEPACOL) 3 MG lozenge Take 1 lozenge (3 mg total) by mouth as needed.  100 tablet    . nicotine (NICODERM CQ - DOSED IN MG/24 HR) 7 mg/24hr patch Place 1 patch onto the skin daily. Do not smoke while on the patch.  28 patch    . predniSONE (DELTASONE) 20 MG tablet Take 2 tablets (40 mg total) by mouth daily before breakfast. Discontinue after 05/17/2011      . traZODone (DESYREL) 150 MG tablet Take 150 mg by mouth at bedtime.         OB/GYN Status:  Patient's last menstrual period was 04/19/2011.  General Assessment Data Living Arrangements: Family members     Risk to self Is patient at risk for suicide?: No Substance abuse history and/or treatment for substance abuse?: Yes                    ADL Screening (condition at time of admission) Patient's cognitive ability adequate to safely complete daily activities?: Yes Patient able to express need for assistance with ADLs?: Yes Independently performs  ADLs?: No Communication: Independent Dressing (OT): Independent Grooming: Needs assistance Is this a change from baseline?: Pre-admission baseline Feeding: Independent Bathing: Needs assistance Is this a change from baseline?: Pre-admission baseline Toileting: Independent In/Out Bed: Independent Walks in Home: Independent Weakness of Legs: None Weakness of Arms/Hands: None  Home Assistive Devices/Equipment Home Assistive Devices/Equipment: None  Therapy Consults (therapy consults require a physician order) PT Evaluation Needed: No OT Evalulation Needed: No SLP Evaluation Needed: No Abuse/Neglect Assessment (Assessment to be complete while patient is alone) Physical Abuse: Denies Verbal Abuse: Denies Sexual Abuse: Denies Exploitation of patient/patient's resources: Denies Self-Neglect: Yes, present (Comment) Values / Beliefs Cultural Requests During Hospitalization: None Spiritual Requests During Hospitalization: None Consults Spiritual Care Consult Needed: No Social Work Consult Needed: No Merchant navy officer (For Healthcare) Advance Directive: Patient does not have advance directive Pre-existing out of facility DNR order (yellow form or pink MOST form): No Nutrition Screen Diet: Carb modified Unintentional weight loss greater than 10lbs within the last month: No Dysphagia: No Home Tube Feeding or Total Parenteral Nutrition (TPN): No Patient appears severely malnourished: No Pregnant or Lactating: No Dietitian Consult Needed: No        Disposition: PT HAS BEEN ACCEPTED TO [facility] BY [facility] & IS GOING TO [facility] 505-2     On Site Evaluation by:   Reviewed with Physician:     Waldron Session 05/16/2011 3:47 PM

## 2011-05-16 NOTE — Progress Notes (Signed)
Report called to [facility]. Receiving RN had no questions or concerns. Security called to escort patient to [facility].

## 2011-05-16 NOTE — Progress Notes (Signed)
Per Care Coordinator on 5E, Fonda, Pt's C-diff is negative.  Notified [facility].  [facility] didn't hold Pt's bed.  [facility] to contact CSW when bed available.  CSW to continue to follow.  Providence Crosby, LCSWA Clinical Social Work [phone]

## 2011-05-16 NOTE — Discharge Summary (Signed)
Discharge Summary  [name] MR#: [id number]  DOB:[birth year]  Date of Admission: 05/15/2011 Date of Discharge: 05/16/2011  Patient's PCP: No primary provider on file.  Attending Physician:Tiquan Bouch A  Consults: None  Discharge Diagnoses: Principal Problem:  *Diarrhea Active Problems:  Schizoaffective disorder, bipolar type  Cannabis abuse  Alcohol dependence  Substance abuse  Pancreatitis  Active smoker  Pseudocyst, pancreas  Nausea vomiting and diarrhea  Abdominal pain, acute, epigastric  Mental retardation  Serum lipase elevation  DM (diabetes mellitus)  Dizziness  Sore throat  Viral URI  Bronchospasm  Brief Admitting History and Physical 56 year old [place] American female with history of diabetes, bipolar disorder, schizoaffective disorder, hypertension, pancreatitis, history of mental retardation who was discharged to behavioral health yesterday for further management came back to the medical service because the patient was having diarrhea.  Discharge Medications Current Discharge Medication List    CONTINUE these medications which have NOT CHANGED   Details  albuterol (PROVENTIL) (5 MG/ML) 0.5% nebulizer solution Take 0.5 mLs (2.5 mg total) by nebulization every 4 (four) hours as needed for wheezing or shortness of breath. Qty: 20 mL    ARIPiprazole (ABILIFY) 5 MG tablet Take 5 mg by mouth 2 (two) times daily.      clonazePAM (KLONOPIN) 0.5 MG tablet Take 1 tablet (0.5 mg total) by mouth 3 (three) times daily at 8am, 2pm and bedtime. Qty: 30 tablet    gabapentin (NEURONTIN) 100 MG capsule Take 2 capsules (200 mg total) by mouth 3 (three) times daily at 8am, 2pm and bedtime.    lisinopril (PRINIVIL,ZESTRIL) 10 MG tablet Take 1 tablet (10 mg total) by mouth daily.    menthol-cetylpyridinium (CEPACOL) 3 MG lozenge Take 1 lozenge (3 mg total) by mouth as needed. Qty: 100 tablet    nicotine (NICODERM CQ - DOSED IN MG/24 HR) 7 mg/24hr patch Place 1 patch  onto the skin daily. Do not smoke while on the patch. Qty: 28 patch    predniSONE (DELTASONE) 20 MG tablet Take 2 tablets (40 mg total) by mouth daily before breakfast. Discontinue after 05/17/2011    traZODone (DESYREL) 150 MG tablet Take 150 mg by mouth at bedtime.         Hospital Course: Recurrent diarrhea but now no nausea or vomiting. Patient has no abdominal pain.  Etiology of diarrhea unclear at this time, likely due to viral gastroenteritis, C. difficile PCR is negative. Stool culture pending but shows no growth to date. Recent history of pancreatitis resolved. 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  Which the patient will need outpatient followup after discharge under the care of primary care physician or gastroenterologist. Tolerating a diabetic diet.   Schizoaffective disorder, bipolar type  Continue psychotropic medications.   Cannabis abuse / Alcohol dependence / Substance abuse  Stable.   Active smoker  Tobacco cessation counseling.   Mental retardation  Likely to d/c to a group home/ALF after completing hospitalization.   Diabetes Mellitus  CBGs 114-171. Continue SSI. Does not appear that she was on medication prior to admission, so likely diet controlled. Her Hgb A1c was 6.4%. Steroids may cause a transient rise in her CBGs. Monitor.  Patient will not be discharged on any diabetic regimen.  Dizziness  H&H stable.   Sore Throat/Viral URI with bronchospasm  Cepacol lozenges. Likely viral. Supportive care with tylenol, PRN nebs. Prednisone 40 mg for a total of 3 days to be discontinued on 05/17/2011.    Day of Discharge BP 149/99  Pulse 94  Temp(Src) 98.5  F (36.9 C) (Oral)  Resp 20  Ht 5\' 3"  (1.6 m)  Wt 66.724 kg (147 lb 1.6 oz)  BMI 26.06 kg/m2  SpO2 90%  LMP 04/19/2011  Results for orders placed during the hospital encounter of 05/15/11 (from the past 48 hour(s))  GLUCOSE, CAPILLARY     Status: Abnormal   Collection Time   05/15/11  9:12 PM       Component Value Range Comment   Glucose-Capillary 134 (*) 70 - 99 (mg/dL)    Comment 1 Notify RN     CLOSTRIDIUM DIFFICILE BY PCR     Status: Normal   Collection Time   05/15/11  9:51 PM      Component Value Range Comment   C difficile by pcr NEGATIVE  NEGATIVE    CBC     Status: Normal   Collection Time   05/16/11  5:25 AM      Component Value Range Comment   WBC 9.1  4.0 - 10.5 (K/uL)    RBC 4.59  3.87 - 5.11 (MIL/uL)    Hemoglobin 13.5  12.0 - 15.0 (g/dL)    HCT 16.1  09.6 - 04.5 (%)    MCV 85.6  78.0 - 100.0 (fL)    MCH 29.4  26.0 - 34.0 (pg)    MCHC 34.4  30.0 - 36.0 (g/dL)    RDW 40.9  81.1 - 91.4 (%)    Platelets 195  150 - 400 (K/uL)   COMPREHENSIVE METABOLIC PANEL     Status: Abnormal   Collection Time   05/16/11  5:25 AM      Component Value Range Comment   Sodium 139  135 - 145 (mEq/L)    Potassium 4.1  3.5 - 5.1 (mEq/L)    Chloride 101  96 - 112 (mEq/L)    CO2 29  19 - 32 (mEq/L)    Glucose, Bld 109 (*) 70 - 99 (mg/dL)    BUN 19  6 - 23 (mg/dL)    Creatinine, Ser 7.82 (*) 0.50 - 1.10 (mg/dL)    Calcium 95.6  8.4 - 10.5 (mg/dL)    Total Protein 7.2  6.0 - 8.3 (g/dL)    Albumin 3.6  3.5 - 5.2 (g/dL)    AST 39 (*) 0 - 37 (U/L)    ALT 39 (*) 0 - 35 (U/L)    Alkaline Phosphatase 66  39 - 117 (U/L)    Total Bilirubin 0.4  0.3 - 1.2 (mg/dL)    GFR calc non Af Amer 46 (*) >90 (mL/min)    GFR calc Af Amer 53 (*) >90 (mL/min)   GLUCOSE, CAPILLARY     Status: Abnormal   Collection Time   05/16/11  7:33 AM      Component Value Range Comment   Glucose-Capillary 129 (*) 70 - 99 (mg/dL)   GLUCOSE, CAPILLARY     Status: Abnormal   Collection Time   05/16/11 11:00 AM      Component Value Range Comment   Glucose-Capillary 123 (*) 70 - 99 (mg/dL)    Comment 1 Notify RN       Mr Brain Wo Contrast  05/10/2011  *RADIOLOGY REPORT*  Clinical Data: Confusion and long history of alcohol abuse.  MRI HEAD WITHOUT CONTRAST  Technique:  Multiplanar, multiecho pulse sequences of the  brain and surrounding structures were obtained according to standard protocol without intravenous contrast.  Comparison: None.  Findings: Minimal periventricular white matter change may be within normal limits for  age.  No acute infarct, hemorrhage, or mass lesion is present.  The ventricles are of normal size.  No significant extra-axial fluid collection is present.  Note is made of hyperostosis frontalis internus.  Flow is present in the major intracranial arteries.  The globes and orbits are intact.  The paranasal sinuses and mastoid air cells are clear. No significant cerebral or cerebellar atrophy is evident.  IMPRESSION: Normal MRI of the brain for age.  Original Report Authenticated By: Jamesetta Orleans. MATTERN, M.D.   Ct Abdomen Pelvis W Contrast  05/10/2011  *RADIOLOGY REPORT*  Clinical Data: Nausea, vomiting, diarrhea, prior history of pancreatitis  CT ABDOMEN AND PELVIS WITH CONTRAST  Technique:  Multidetector CT imaging of the abdomen and pelvis was performed following the standard protocol during bolus administration of intravenous contrast.  Contrast: OMNIPAQUE IOHEXOL 300 MG/ML IV SOLN  Comparison: Abdomen films of 05/10/2011  Findings: Linear atelectasis or scarring is noted at the lung bases.  The liver enhances with no focal abnormality and no ductal dilatation is seen.  The gallbladder is somewhat contracted and no calcified gallstones are noted.  The pancreas is unremarkable and the pancreatic duct is not dilated.  There is a small low attenuation structure near the tail of the pancreas which may represent a pseudocyst of 13 mm in diameter.  The adrenal glands and spleen are unremarkable.  The stomach is not distended.  The kidneys enhance with no definite abnormality.  There is low attenuation area in the upper pole of the left kidney which is indeterminate, better seen on coronal images and follow-up is recommended.  The abdominal aorta is normal in caliber.  No adenopathy is seen.  The  uterus is normal in size.  No adnexal lesion is seen.  Only a tiny amount of free fluid is noted within the pelvis.  The urinary bladder is not well distended and is slightly thick-walled. Cystitis cannot be excluded.  Diverticula scattered diffusely throughout the colon but no diverticulitis is seen.  The terminal ileum is unremarkable.  No bony abnormality is seen.  IMPRESSION:  1.  No evidence of acute pancreatitis. 2.  Small low attenuation structure near the tail of the pancreas may represent a 13 mm pseudocyst. 3.  Small indeterminate lesion in the upper pole of the left kidney.  Consider follow-up. 4.  Multiple colonic diverticula diffusely. 5.  Somewhat thick-walled urinary bladder.  Cannot exclude cystitis.  Original Report Authenticated By: Juline Patch, M.D.   Dg Chest Port 1 View  05/13/2011  *RADIOLOGY REPORT*  Clinical Data: Fever, evaluate for aspiration  PORTABLE CHEST - 1 VIEW  Comparison: Acute abdominal radiographic series - 05/10/2011; abdominal CT - 05/10/2011  Findings: Grossly unchanged cardiac silhouette and mediastinal contours.  No focal airspace opacities.  No pleural effusion or pneumothorax.  Grossly unchanged bones.  IMPRESSION: No acute cardiopulmonary disease.  Specifically, no evidence of pneumonia.  Original Report Authenticated By: Waynard Reeds, M.D.   Dg Abd Acute W/chest  05/10/2011  *RADIOLOGY REPORT*  Clinical Data: Upper abdominal pain with nausea and vomiting.  ACUTE ABDOMEN SERIES (ABDOMEN 2 VIEW & CHEST 1 VIEW)  Comparison:  None.  Findings:  There is no evidence of dilated bowel loops or free intraperitoneal air.  No radiopaque calculi or other significant radiographic abnormality is seen. Heart size and mediastinal contours are within normal limits.  Both lungs are clear.  There is mild peribronchial thickening which could suggest chronic bronchitis.  There is no effusion or  pneumothorax.  IMPRESSION: Negative abdominal radiographs.  No acute cardiopulmonary  disease. Question chronic bronchitis.  Original Report Authenticated By: Elsie Stain, M.D.   Disposition: To behavioral health  Diet: Diabetic diet  Activity: Resume as tolerated  Follow-up Appts:   TESTS THAT NEED FOLLOW-UP Followup with PCP in 1 week after discharge. Will need outpatient followup with PCP for management of patient's pancreatic pseudocyst.  Time spent on discharge, talking to the patient, and coordinating care: 25 mins.  Signed: Cristal Ford, MD 05/16/2011, 1:46 PM

## 2011-05-16 NOTE — Progress Notes (Signed)
Bed ready at [facility]   Notified MD, RN and Care Coordinator and Pt.    Care Coordinator notified daughter.  Pt signed consents.  In an attempt to assist Pt's daughter in obtaining admission for Pt at a group home once Pt is d/c'd from [facility], Pt to have a TB test, as well as a tetanus shot prior to admission to [facility].  Verified that [facility] can TB test, when needed.  Faxed consents to [facility].  Pt to be d/c'd.  Providence Crosby, LCSWA Clinical Social Work [phone]

## 2011-05-16 NOTE — Progress Notes (Signed)
  [name] is a 56 y.o. female [id number] 05-02-1956  05/16/2011 Active Problems:  * No active hospital problems. *  Schizoaffective -Bipolar   Mental Status :alert & oriented -enjoying karaoke. Says the meds are the good stuff-keep her smooth. Denies SI/HI/AVH. Mood is friendly and she smiles easily.  Says she can go to her family in [place] at discharge. IQ is baseline to below normal.   Subjective/Objective: Originally admitted 05/07/11- sent to ED for evaluation of abdominal pain 1/4 medically stabilized and ready for transfer back to [facility] 1/6 but no bed until now.    Filed Vitals:   05/16/11 1701  BP: 164/100  Pulse: 75  Temp: 98.6 F (37 C)  Resp: 20    Lab Results:   BMET    Component Value Date/Time   NA 139 05/16/2011 0525   K 4.1 05/16/2011 0525   CL 101 05/16/2011 0525   CO2 29 05/16/2011 0525   GLUCOSE 109* 05/16/2011 0525   BUN 19 05/16/2011 0525   CREATININE 1.28* 05/16/2011 0525   CALCIUM 10.1 05/16/2011 0525   GFRNONAA 46* 05/16/2011 0525   GFRAA 53* 05/16/2011 0525    Medications:  Scheduled:     . ARIPiprazole  5 mg Oral BID  . clonazePAM  0.5 mg Oral [id number]  . gabapentin  200 mg Oral [id number]  . lisinopril  10 mg Oral Daily  . nicotine  7 mg Transdermal Daily  . predniSONE  40 mg Oral QAC breakfast  . traZODone  150 mg Oral QHS     PRN Meds acetaminophen, albuterol, alum & mag hydroxide-simeth, magnesium hydroxide, menthol-cetylpyridinium  Plan: continue current treatment plan.           Case manager to start discharge plans.   Shellia Hartl,MICKIE D. 05/16/2011

## 2011-05-16 NOTE — Progress Notes (Addendum)
Subjective: Reports that she had a bowel movement today, however per nursing patient has not had any bowel movements.  CT PCR negative.  Objective: Vital signs in last 24 hours: Filed Vitals:   05/15/11 2237 05/16/11 0517 05/16/11 0528 05/16/11 0535  BP: 158/95  156/104 149/99  Pulse: 92  94   Temp: 98.6 F (37 C)  98.5 F (36.9 C)   TempSrc: Oral  Oral   Resp: 20  20   Height:  5\' 3"  (1.6 m)    Weight:  66.724 kg (147 lb 1.6 oz)    SpO2: 95%  90%    Weight change:   Intake/Output Summary (Last 24 hours) at 05/16/11 1308 Last data filed at 05/16/11 0808  Gross per 24 hour  Intake    240 ml  Output      1 ml  Net    239 ml    Physical Exam: General: Awake, Oriented, No acute distress. HEENT: EOMI. Neck: Supple CV: S1 and S2 Lungs: Clear to ascultation bilaterally Abdomen: Soft, Nontender, Nondistended, +bowel sounds. Ext: Good pulses. Trace edema.  Lab Results:  [facility] 0525 05/15/11 0525  NA 139 135  K 4.1 4.8  CL 101 98  CO2 29 25  GLUCOSE 109* 211*  BUN 19 20  CREATININE 1.28* 1.09  CALCIUM 10.1 10.1  MG -- --  PHOS -- --    Basename 05/16/11 0525  AST 39*  ALT 39*  ALKPHOS 66  BILITOT 0.4  PROT 7.2  ALBUMIN 3.6   No results found for this basename: LIPASE:2,AMYLASE:2 in the last 72 hours  Basename 05/16/11 0525 05/15/11 0525  WBC 9.1 4.0  NEUTROABS -- --  HGB 13.5 13.0  HCT 39.3 39.7  MCV 85.6 87.1  PLT 195 169   No results found for this basename: CKTOTAL:3,CKMB:3,CKMBINDEX:3,TROPONINI:3 in the last 72 hours No components found with this basename: POCBNP:3 No results found for this basename: DDIMER:2 in the last 72 hours No results found for this basename: HGBA1C:2 in the last 72 hours No results found for this basename: CHOL:2,HDL:2,LDLCALC:2,TRIG:2,CHOLHDL:2,LDLDIRECT:2 in the last 72 hours No results found for this basename: TSH,T4TOTAL,FREET3,T3FREE,THYROIDAB in the last 72 hours No results found for this basename:  VITAMINB12:2,FOLATE:2,FERRITIN:2,TIBC:2,IRON:2,RETICCTPCT:2 in the last 72 hours  Micro Results: Recent Results (from the past 240 hour(s))  URINE CULTURE     Status: Normal   Collection Time   05/13/11  2:57 AM      Component Value Range Status Comment   Specimen Description URINE, CLEAN CATCH   Final    Special Requests Normal   Final    Setup Time [id number]   Final    Colony Count 40,000 COLONIES/ML   Final    Culture     Final    Value: Multiple bacterial morphotypes present, none predominant. Suggest appropriate recollection if clinically indicated.   Report Status 05/14/2011 FINAL   Final   CULTURE, BLOOD (ROUTINE X 2)     Status: Normal (Preliminary result)   Collection Time   05/13/11  3:00 AM      Component Value Range Status Comment   Specimen Description BLOOD IV   Final    Special Requests BOTTLES DRAWN AEROBIC AND ANAEROBIC 5 CC EA   Final    Setup Time [id number]   Final    Culture     Final    Value:        BLOOD CULTURE RECEIVED NO GROWTH TO DATE CULTURE WILL BE HELD FOR 5  DAYS BEFORE ISSUING A FINAL NEGATIVE REPORT   Report Status PENDING   Incomplete   CULTURE, BLOOD (ROUTINE X 2)     Status: Normal (Preliminary result)   Collection Time   05/13/11  3:06 AM      Component Value Range Status Comment   Specimen Description BLOOD LEFT FOREARM   Final    Special Requests BOTTLES DRAWN AEROBIC ONLY 7 CC   Final    Setup Time [id number]   Final    Culture     Final    Value:        BLOOD CULTURE RECEIVED NO GROWTH TO DATE CULTURE WILL BE HELD FOR 5 DAYS BEFORE ISSUING A FINAL NEGATIVE REPORT   Report Status PENDING   Incomplete   CLOSTRIDIUM DIFFICILE BY PCR     Status: Normal   Collection Time   05/15/11  9:51 PM      Component Value Range Status Comment   C difficile by pcr NEGATIVE  NEGATIVE  Final     Studies/Results: No results found.  Medications: I have reviewed the patient's current medications. Scheduled Meds:   . enoxaparin  40 mg Subcutaneous Q24H   . insulin aspart  0-15 Units Subcutaneous TID WC   Continuous Infusions:  PRN Meds:.acetaminophen, acetaminophen  Assessment/Plan: Recurrent diarrhea but now no nausea or vomiting. Patient has no abdominal pain.  Etiology of diarrhea unclear at this time, likely due to viral gastroenteritis, C. difficile PCR is negative.  Stool culture shows no growth to date.  Recent pancreatitis resolved. 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  Tolerating a diabetic diet.   Schizoaffective disorder, bipolar type  Continue psychotropic medications.  Cannabis abuse / Alcohol dependence / Substance abuse  Stable.   Active smoker  Tobacco cessation counseling.  Mental retardation  Likely to d/c to a group home/ALF after completing hospitalization.   Diabetes Mellitus  CBGs 114-171. Continue SSI. Does not appear that she was on medication prior to admission, so likely diet controlled. Her Hgb A1c was 6.4%. Steroids may cause a transient rise in her CBGs. Monitor.   Dizziness  H&H stable.   Sore Throat/Viral URI with bronchospasm  Cepacol lozenges. Likely viral. Supportive care with tylenol, PRN nebs. Prednisone 40 mg today for a total of 3 days.   Code status  Full code.  Disposition Patient stable to be discharged to behavioral health once bed is available.   LOS: 1 day  Zidane Renner A, MD 05/16/2011, 1:08 PM  Addendum: Placed PPD and administered Tdap vaccine at discharge.  Patient will need PPD to be read at behavioral health.  Lonya Johannesen A, MD 05/16/2011, 1:55 PM

## 2011-05-17 DIAGNOSIS — F79 Unspecified intellectual disabilities: Secondary | ICD-10-CM

## 2011-05-17 DIAGNOSIS — F259 Schizoaffective disorder, unspecified: Principal | ICD-10-CM

## 2011-05-17 LAB — GLUCOSE, CAPILLARY
Glucose-Capillary: 217 mg/dL — ABNORMAL HIGH (ref 70–99)
Glucose-Capillary: 123 mg/dL — ABNORMAL HIGH (ref 70–99)
Glucose-Capillary: 264 mg/dL — ABNORMAL HIGH (ref 70–99)

## 2011-05-17 MED ORDER — DONEPEZIL HCL 5 MG PO TABS
5.0000 mg | ORAL_TABLET | Freq: Every day | ORAL | Status: DC
  Administered 2011-05-17 – 2011-05-23 (×7): 5 mg via ORAL
  Filled 2011-05-17: qty 5
  Filled 2011-05-17 (×5): qty 1
  Filled 2011-05-17 (×2): qty 14
  Filled 2011-05-17 (×2): qty 1

## 2011-05-17 MED ORDER — LISINOPRIL 20 MG PO TABS
20.0000 mg | ORAL_TABLET | Freq: Every day | ORAL | Status: DC
  Administered 2011-05-18 – 2011-05-24 (×7): 20 mg via ORAL
  Filled 2011-05-17 (×9): qty 1

## 2011-05-17 MED ORDER — ARIPIPRAZOLE 5 MG PO TABS
5.0000 mg | ORAL_TABLET | Freq: Every day | ORAL | Status: DC
  Administered 2011-05-18 – 2011-05-19 (×2): 5 mg via ORAL
  Filled 2011-05-17 (×4): qty 1

## 2011-05-17 MED ORDER — METFORMIN HCL 500 MG PO TABS
500.0000 mg | ORAL_TABLET | Freq: Two times a day (BID) | ORAL | Status: DC
  Administered 2011-05-17 – 2011-05-18 (×2): 500 mg via ORAL
  Filled 2011-05-17 (×4): qty 1

## 2011-05-17 NOTE — H&P (Signed)
  Identifying information : This is a 56 year old [place] American female, this is a voluntary admission.  History of present illness:  [name] presents on referral from internal medicine after stay of several days for evaluation of per fruit juice and diarrhea with fever. She was found to have an elevated lipase level and was treated for pancreatitis. She was also diagnosed with viral upper respiratory infection with bronchospasm and started on a prednisone taper. She been previously transferred to the medicine service from adult psychiatry where she had been admitted for some disorganized thought and poor impulse control.  Today she returns for readmission. While being evaluated by the nurses, she was found to have profuse diarrhea uncontrolled. She was seen in consultation by myself and Dr. Orson Aloe. It was noted that she had not had a screen for C. difficile infection. We contacted internal medicine who agreed to readmit the patient and evaluate her for possible C. difficile.  Date of admission: 05/15/2011 Date of discharge 05/15/2011.  She is transferred by [facility] to internal medicine for readmission.

## 2011-05-17 NOTE — Progress Notes (Signed)
Recreation Therapy Notes  05/17/2011         Time: 1100      Group Topic/Focus: The focus of the group is on enhancing the patients' ability to cope with stressors by understanding what coping is, why it is important, the negative effects of stress and developing healthier coping skills. Patients practice [place] and discuss how exercise can be used as a healthy coping strategy.  Participation Level: Active  Participation Quality: Redirectable and Intrusive  Affect: Excited  Cognitive: Alert   Additional Comments: Patient loud and intrusive at times, reporting she needs help getting a social security card. Patient participated in the exercises when mirrored by RT  Jedd Schulenburg 05/17/2011 12:23 PM

## 2011-05-17 NOTE — Progress Notes (Signed)
Adult Psychosocial Assessment Update Interdisciplinary Team  [facility]es:  Admissions Discharges  Date:05/07/11 Date:  Date: Date:  Date: Date:  Date: Date:  Date: Date:   Changes since the last Psychosocial Assessment (including adherence to outpatient mental health and/or substance abuse treatment, situational issues contributing to decompensation and/or relapse). None- patient has been on the medical floor             Discharge Plan 1. Will you be returning to the same living situation after discharge?   Yes: No:   x   If no, what is your plan?    The plan is to find a group home placement for patient       2. Would you like a referral for services when you are discharged? Yes: x    If yes, for what services?  No:       Patient will need follow up with mental health services       Summary and Recommendations (to be completed by the evaluator) Patient is a 56 year old African -American female with diagnosis of Bipolar, Mixed and Schizoaffective D/O. Patient was readmitted from the medical floor. She continues to exhibit confusion, not reality oriented and appears to be responding to internal stimuli. Patient would benefit from crisis stabilization, medication evaluation, group therapy and psycho-education groups to work on coping skills, case management for referral to group home and counselor to contact family.                       Signature:  [name], 05/17/2011 9:01 AM

## 2011-05-17 NOTE — Progress Notes (Signed)
Patient pleasant this evening. Confused and asking the same question repeatedly. Requested for nails to be cut on thumbs. Active in the milieu, visible in the hallway and dayroom. Denies SI/HI/AV.

## 2011-05-17 NOTE — Tx Team (Signed)
Interdisciplinary Treatment Plan Update (Adult)  Date:  05/17/2011  Time Reviewed:  10:44 AM   Progress in Treatment: Attending groups:  Yes Participating in groups:    Yes, intrusive and delusional Taking medication as prescribed:    Yes, no refusals Tolerating medication:   Yes, no reported or noted side effects Family/Significant other contact made:  Yes, during previous admission prior to going to medical floor Patient understands diagnosis:   No, no insight, poor judgment Discussing patient identified problems/goals with staff:   Yes, but content is delusional Medical problems stabilized or resolved:   Yes Denies suicidal/homicidal ideation:  Yes, adamantly denies Issues/concerns per patient self-inventory:   None Other:  New problem(s) identified: No, Describe:    Reason for Continuation of Hospitalization: Delusions  Depression Hallucinations Medication stabilization Other; describe placement into ALF  Interventions implemented related to continuation of hospitalization:  Medication monitoring and adjustment, safety checks Q15 min., group therapy, psychoeducation, collateral contact  Additional comments:  Not applicable  Estimated length of stay:  5-7 days  Discharge Plan:  Assisted Living Facility  New goal(s):  Not applicable  Review of initial/current patient goals per problem list:   1.  Goal(s):  Reduce psychotic symptoms to baseline.  Met:  No  Target date:  By Discharge   As evidenced by:  Delusional, all thought processes guided by her delusions  2.  Goal(s):  Decide if / how to address substance abuse issues.  Met:  No  Target date:  By Discharge   As evidenced by:  Too delusional to discuss this  3.  Goal(s):  Find placement for patient.  Met:  No  Target date:  By Discharge   As evidenced by:  FL-2 being developed to send out    Attendees: Patient:  [name]  05/17/2011 11:10  Family:     Physician:  Dr. Harvie Heck Readling 05/17/2011   11:10  Nursing:   Tacy Learn, RN 05/17/2011    11:10  Case Manager:  Ambrose Mantle, LCSW 05/17/2011   11:10  Counselor:  Veto Kemps, MT-BC 05/17/2011   11:10  Other:      Other:      Other:      Other:       Scribe for Treatment Team:   Sarina Ser, 05/17/2011, 10:44 AM

## 2011-05-17 NOTE — Progress Notes (Signed)
Patient resting quietly with eyes closed. Respirations even and unlabored. No distress noted.

## 2011-05-17 NOTE — Progress Notes (Signed)
Patient has been very pleasant and cooperative this shift.  Asked several times if she was going to have her blood drawn today.  Insists that she has not had blood work in a long time.  Reassured patient that the doctor was aware of when her last blood work was done and that it would be ordered when necessary.  Remains confused, but is easily redirected.  Interacting well with staff and peers.

## 2011-05-17 NOTE — Progress Notes (Signed)
[facility] Group Notes:  (Counselor/Nursing/MHT/Case Management/Adjunct)  05/17/2011 1:29 PM  Type of Therapy:  Group therapy  Participation Level:  Active  Participation Quality:  Attentive and Sharing  Affect:  Appropriate  Cognitive:  Confused  Insight:  None  Engagement in Group:  Good  Engagement in Therapy:  Good  Modes of Intervention:  Orientation and Support  Summary of Progress/Problems: Patient was confused and not reality oriented. She was pleasant with counselor and peers.   Bryley Kovacevic, Aram Beecham 05/17/2011, 1:29 PM

## 2011-05-17 NOTE — Discharge Planning (Signed)
Met with patient in Aftercare Planning Group.   She was confused, intrusive, and delusional throughout the group.  At one point, she pointed to a picture in the newspaper of a [place] (the cat) and said that was a picture of her relatives in [place].  She kept asking for assistance with catching an airplane, and said they are nice to her on the airplane.  Very pleasant, but delusional.  York Spaniel she will not go to an assisted living facility, but will go home with her daughter, which staff has been told is not possible.  Needs placement.  FL-2 to be done and sent out when patient more stable.  During Aftercare Planning Group, Case Manager provided psychoeducation on "Suicide Prevention Information."  This included descriptions of risk factors for suicide, warning signs that an individual is in crisis and thinking of suicide, and what to do if this occurs.  Pt indicated understanding of information provided, and will read brochure given upon discharge.   However, it is doubtful if patient actually understood at this point due to her confusion.  Ambrose Mantle, LCSW 05/17/2011, 2:15 PM

## 2011-05-17 NOTE — Progress Notes (Signed)
Suicide Risk Assessment  Admission Assessment     Demographic factors:  Assessment Details Time of Assessment: Admission Information Obtained From: Patient Current Mental Status:  Current Mental Status: Self-harm thoughts Loss Factors:    Historical Factors:  Historical Factors: Family history of suicide;Family history of mental illness or substance abuse Risk Reduction Factors:  Risk Reduction Factors: Living with another person, especially a relative  CLINICAL FACTORS:   Alcohol/Substance Abuse/Dependencies More than one psychiatric diagnosis Previous Psychiatric Diagnoses and Treatments Medical Diagnoses and Treatments/Surgeries  COGNITIVE FEATURES THAT CONTRIBUTE TO RISK:  Loss of executive function    Diagnosis:  Axis I: Schizoaffective Disorder - Bipolar Type.  Alcohol Abuse.  Cannabis Abuse.   The patient was seen today and reports the following:   ADL's: Intact  Sleep: The patient reports to sleeping well without difficulty.  Appetite: The patient reports a good appetite.   Mild>(1-10) >Severe  Hopelessness (1-10): 0  Depression (1-10): 0  Anxiety (1-10): 0   Suicidal Ideation: The patient adamantly denies any current suicidal ideations.  Plan: No  Intent: No  Means: No   Homicidal Ideation: The patient adamantly denies any homicidal ideations today.  Plan: No  Intent: No.  Means: No   General Appearance /Behavior: Casual.  Eye Contact: Good.  Speech: Appropriate in rate and volume but with much spontaneous speech.  Motor Behavior: Mildly hyperactive.  Level of Consciousness: Alert and Oriented to place and person only.  Mental Status: Alert and oriented x 2 as above.  Mood: Mildly Manic.  Affect: Mildly Expansive.  Anxiety Level: None Reported.  Thought Process: Moderate flight of ideas.  Thought Content: No auditory or visual hallucinations reported today. The patient remains confused and possibly delusional. States today that she wants to go to  [place] and join [employer]. Perception: Confused and possibly delusional.  Judgment: Poor.  Insight: Poor.  Cognition: Orientation to time, place and person.   Treatment Plan Summary:  1. Daily contact with patient to assess and evaluate symptoms and progress in treatment  2. Medication management  3. The patient will deny suicidal ideations or homicidal ideations for 48 hours prior to discharge and have a depression and anxiety rating of 3 or less. The patient will also deny any auditory or visual hallucinations or delusional thinking.   Plan:  1.  Will increase Lisinopril to 20 mgs po q am for elevated blood pressure. 2.  Will start Metformin 500 mgs po BID - AC for elevated blood glucose. 3.  Will decrease Abilify to 5 mgs po qhs for delusions. 4.  Will start Aricept 5 mgs po qhs for possible dementia. 5.  Will continue to monitor. 6.  Placement needed.  SUICIDE RISK:   Minimal: No identifiable suicidal ideation.  Patients presenting with no risk factors but with morbid ruminations; may be classified as minimal risk based on the severity of the depressive symptoms   [name] 05/17/2011, 2:13 PM

## 2011-05-18 DIAGNOSIS — F1210 Cannabis abuse, uncomplicated: Secondary | ICD-10-CM

## 2011-05-18 DIAGNOSIS — F1010 Alcohol abuse, uncomplicated: Secondary | ICD-10-CM

## 2011-05-18 DIAGNOSIS — F319 Bipolar disorder, unspecified: Secondary | ICD-10-CM

## 2011-05-18 LAB — GLUCOSE, CAPILLARY
Glucose-Capillary: 144 mg/dL — ABNORMAL HIGH (ref 70–99)
Glucose-Capillary: 123 mg/dL — ABNORMAL HIGH (ref 70–99)
Glucose-Capillary: 147 mg/dL — ABNORMAL HIGH (ref 70–99)

## 2011-05-18 LAB — COMPREHENSIVE METABOLIC PANEL
AST: 28 U/L (ref 0–37)
Albumin: 3.5 g/dL (ref 3.5–5.2)
BUN: 18 mg/dL (ref 6–23)
Calcium: 9.9 mg/dL (ref 8.4–10.5)
Creatinine, Ser: 1.18 mg/dL — ABNORMAL HIGH (ref 0.50–1.10)
Total Protein: 7 g/dL (ref 6.0–8.3)

## 2011-05-18 MED ORDER — INSULIN ASPART 100 UNIT/ML ~~LOC~~ SOLN
0.0000 [IU] | Freq: Three times a day (TID) | SUBCUTANEOUS | Status: DC
  Administered 2011-05-18 – 2011-05-21 (×5): 1 [IU] via SUBCUTANEOUS
  Administered 2011-05-21 – 2011-05-22 (×2): 2 [IU] via SUBCUTANEOUS
  Administered 2011-05-22 – 2011-05-24 (×5): 1 [IU] via SUBCUTANEOUS
  Filled 2011-05-18: qty 3

## 2011-05-18 NOTE — Progress Notes (Signed)
Patient ID: [name], female   DOB: 1955/11/28, 56 y.o.   MRN: [id number]  O: Tracie is up and appropriately dressed and groomed and socializing in the hallway.  Mood is calm, affect - smiling, greets me. Fully oriented with non-pressured speech - she is pleasant and directable.  She not as mentally driven today - not talking about joining the marines or any other missions. 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  Says she is not depressed and has no dangerous ideas.   Some hypersexual comments to female nurse this morning.  Staff report she has been mildly intrusive and request she not have a roommate.    Says she has had no more diarrhea.  Having some abdominal discomfort that is mild and asked the nurses for milk of magnesia.  Says the metformin made her sick and doesn't want to take it.    Reports she lived in a group home when she first came to [place] from [place] but doesn't remember the name of it.  Has a sister in [place] who would be willing to take care of her but cannot tell me how to contact her.   Her Creatnine on 05/14/2011 was 1.28 compared to previous baseline reading of 1.09.  And she continues to have some mild abdominal discomfort.  She asked me if she had to take those 'horse pills' referring to the metformin.    P: I am going to stop her metformin for now and recheck a CMET/Creatnine, given her history of pancreatitis.  Will place on sliding scale insulin for now and consider another plan for diabetes management.

## 2011-05-18 NOTE — Progress Notes (Signed)
[facility] Group Notes:  (Counselor/Nursing/MHT/Case Management/Adjunct)  05/18/2011 11 AM  Type of Therapy:  Group Therapy, Dance/Movement Therapy   Participation Level:  Minimal  Participation Quality:  Attentive  Affect:  Excited  Cognitive:  Confused and Hallucinating  Insight:  Limited  Engagement in Group:  Limited  Engagement in Therapy:  Limited  Modes of Intervention:  Clarification, Problem-solving, Role-play, Socialization and Support  Summary of Progress/Problems: pt participated in a group discussion on feelings. Pt stated that they felt like the color "purple" for her daughter brought her purple socks and they make her happy. Pt spoke about random thoughts and had difficulty following the groups conversation.    [name]

## 2011-05-18 NOTE — Progress Notes (Signed)
[facility] Group Notes:  (Counselor/Nursing/MHT/Case Management/Adjunct)  05/18/2011 4:20 PM  Type of Therapy:  After Care Planning Group  Pt. attedned after care planning group and was given [facility] SI information and crisis and hotline numbers . Pt. Agreed to use them if needed.    Lamar Blinks Kirby 05/18/2011, 4:20 PM

## 2011-05-18 NOTE — Progress Notes (Signed)
Pt is intrusive and demanding to be allowed to pick clothes from the closet even though she has many items in her room   She had inappropriate boundaries with a female staff member and is hypersexual   She is tangential in her thinking and making rymes with peoples names   She took her medications but said the glucophage made her sick so she did not want it anymore   She needs frequent redirection and reminders of appropriate boundaries   Verbal support given  Medications administered and effectiveness monitored  Redirect as needed  Q 15 min checks Pt safe at present

## 2011-05-19 LAB — GLUCOSE, CAPILLARY: Glucose-Capillary: 123 mg/dL — ABNORMAL HIGH (ref 70–99)

## 2011-05-19 NOTE — Progress Notes (Signed)
[facility] Group Notes:  (Counselor/Nursing/MHT/Case Management/Adjunct)  05/19/2011 11 AM  Type of Therapy:  Group Therapy, Dance/Movement Therapy   Participation Level:  Active  Participation Quality:  Appropriate, Attentive, Sharing and Supportive  Affect:  Appropriate  Cognitive:  Appropriate  Insight:  Limited, confused  Engagement in Group:  Limited  Engagement in Therapy:  Limited  Modes of Intervention:  Clarification, Problem-solving, Role-play, Socialization and Support  Summary of Progress/Problems:Pt participated in a group discussion on how sunshine helps elevate mood. Pt stated that they feel "great" today. Pt spoke about how sun helps her feel better and how Gods love comes through the sunshine. Pt had difficulty staying with the topic of the groups discussion.

## 2011-05-19 NOTE — Progress Notes (Signed)
Patient ID: [name], female   DOB: [birth year], 56 y.o.   MRN: [id number] BUN 18/Cr 1.18  [name] is fully alert and up and about in the millieu.  Wants me to get her a purse and some perfumed body spray.  She has been participating in groups though her responses are irrelevant much of the time. No evidence of suicidal thoughts or internal distractions. She does not appear physically or verbally intrusive today and respects boundaries as we speak.   Metformin is stopped due to GI distress.   P: Continue current meds and plans for placement.

## 2011-05-19 NOTE — Progress Notes (Signed)
Patient ID: [name], female   DOB: [birth year], 56 y.o.   MRN: [id number] Has been out on hall this evening, pacing at times, but did attend group.  Has c/o lower abdominal discomfort tonight, was given tylenol earlier for pain she initially rated as 20, but didn't seem to be effected by the discomfort.  Was found to be hoarding snacks, cbg was 207 tonight, has continued to ask for cookies, etc. Took most of her meds, refused part of her trazadone dose, took 50 mg of 150 mg.  At this time it is 03:38, and she hs been up most of the night.  Will continue to monitor.

## 2011-05-19 NOTE — Progress Notes (Signed)
Pt has been intrusive and has loose boundaries  She is pleasant and cooperative but needs frequent redirection and limit setting  She is non compliant with her diet even though she is encouraged to make good choices  She requests multiple snacks and tries to hoard them in her room  She bacame upset when the snacks were removed from her room earlier and said the police came and got the snacks and could i give them back to her  It was explained to her we woulld provide snacks to be eaten in the dayroom but she could not hoard them in her room  She verbalized an understanding  Medications have been administered and continued monitoring of effectiveness is being done  Q 15 min checks  Pt is safe

## 2011-05-19 NOTE — Progress Notes (Signed)
[facility] Group Notes:  (Counselor/Nursing/MHT/Case Management/Adjunct)  05/19/2011 6:43 PM Type of Therapy:  After Care group  Pt. Was given [facility] SI information and crisis and hotline numbers. Pt. Agreed to use them if needed.   [name] 05/19/2011, 6:43 PM

## 2011-05-20 LAB — GLUCOSE, CAPILLARY
Glucose-Capillary: 126 mg/dL — ABNORMAL HIGH (ref 70–99)
Glucose-Capillary: 129 mg/dL — ABNORMAL HIGH (ref 70–99)

## 2011-05-20 MED ORDER — HALOPERIDOL 0.5 MG PO TABS
0.5000 mg | ORAL_TABLET | Freq: Every day | ORAL | Status: DC
  Administered 2011-05-20 – 2011-05-23 (×4): 0.5 mg via ORAL
  Filled 2011-05-20 (×6): qty 1

## 2011-05-20 MED ORDER — GABAPENTIN 300 MG PO CAPS
300.0000 mg | ORAL_CAPSULE | ORAL | Status: DC
  Administered 2011-05-20 – 2011-05-24 (×11): 300 mg via ORAL
  Filled 2011-05-20 (×16): qty 1

## 2011-05-20 MED ORDER — MAGNESIUM HYDROXIDE 400 MG/5ML PO SUSP: 30.0000 mL | Freq: Every day | ORAL | Status: DC | PRN

## 2011-05-20 NOTE — Progress Notes (Signed)
[facility] MD Progress Note  05/20/2011 4:51 PM  Diagnosis:  Axis I: Schizoaffective Disorder - Bipolar Type.  Alcohol Abuse.  Cannabis Abuse.   The patient was seen today and reports the following:   ADL's: Intact  Sleep: The patient reports to sleeping well without difficulty.  Appetite: The patient reports a good appetite.   Mild>(1-10) >Severe  Hopelessness (1-10): 0  Depression (1-10): 0  Anxiety (1-10): 0   Suicidal Ideation: The patient adamantly denies any current suicidal ideations.  Plan: No  Intent: No  Means: No   Homicidal Ideation: The patient adamantly denies any homicidal ideations today.  Plan: No  Intent: No.  Means: No   General Appearance /Behavior: Casual.  Eye Contact: Good.  Speech: Appropriate in rate and volume but with much spontaneous speech.  Motor Behavior: Mildly hyperactive.  Level of Consciousness: Alert and Oriented to place and person only.  Mental Status: Alert and oriented x 2 as above.  Mood: Mildly Manic.  Affect: Mildly Expansive.  Anxiety Level: None Reported.  Thought Process: Moderate flight of ideas.  Thought Content: No auditory or visual hallucinations reported today. The patient remains confused today but with no delusions noted. Perception: Confused.  Judgment: Poor.  Insight: Poor.  Cognition: Orientation to time, place and person.  Sleep:  Number of Hours: 6   Vital Signs:Blood pressure 146/94, pulse 94, temperature 98.4 F (36.9 C), temperature source Oral, resp. rate 20, last menstrual period 04/19/2011, SpO2 94.00%.  Lab Results:  Results for orders placed during the hospital encounter of 05/16/11 (from the past 48 hour(s))  GLUCOSE, CAPILLARY     Status: Abnormal   Collection Time   05/18/11  5:05 PM      Component Value Range Comment   Glucose-Capillary 147 (*) 70 - 99 (mg/dL)    Comment 1 Notify RN     COMPREHENSIVE METABOLIC PANEL     Status: Abnormal   Collection Time   05/18/11  8:02 PM      Component Value  Range Comment   Sodium 136  135 - 145 (mEq/L)    Potassium 4.8  3.5 - 5.1 (mEq/L)    Chloride 101  96 - 112 (mEq/L)    CO2 26  19 - 32 (mEq/L)    Glucose, Bld 99  70 - 99 (mg/dL)    BUN 18  6 - 23 (mg/dL)    Creatinine, Ser 1.61 (*) 0.50 - 1.10 (mg/dL)    Calcium 9.9  8.4 - 10.5 (mg/dL)    Total Protein 7.0  6.0 - 8.3 (g/dL)    Albumin 3.5  3.5 - 5.2 (g/dL)    AST 28  0 - 37 (U/L) SLIGHT HEMOLYSIS   ALT 37 (*) 0 - 35 (U/L)    Alkaline Phosphatase 68  39 - 117 (U/L)    Total Bilirubin 0.2 (*) 0.3 - 1.2 (mg/dL)    GFR calc non Af Amer 51 (*) >90 (mL/min)    GFR calc Af Amer 59 (*) >90 (mL/min)   GLUCOSE, CAPILLARY     Status: Abnormal   Collection Time   05/18/11  9:22 PM      Component Value Range Comment   Glucose-Capillary 207 (*) 70 - 99 (mg/dL)   GLUCOSE, CAPILLARY     Status: Abnormal   Collection Time   05/19/11  6:36 AM      Component Value Range Comment   Glucose-Capillary 103 (*) 70 - 99 (mg/dL)    Comment 1 Notify RN  GLUCOSE, CAPILLARY     Status: Normal   Collection Time   05/19/11 11:28 AM      Component Value Range Comment   Glucose-Capillary 87  70 - 99 (mg/dL)    Comment 1 Notify RN     GLUCOSE, CAPILLARY     Status: Abnormal   Collection Time   05/19/11  5:05 PM      Component Value Range Comment   Glucose-Capillary 107 (*) 70 - 99 (mg/dL)    Comment 1 Notify RN     GLUCOSE, CAPILLARY     Status: Abnormal   Collection Time   05/19/11  9:09 PM      Component Value Range Comment   Glucose-Capillary 123 (*) 70 - 99 (mg/dL)    Comment 1 Notify RN     GLUCOSE, CAPILLARY     Status: Abnormal   Collection Time   05/20/11  6:24 AM      Component Value Range Comment   Glucose-Capillary 126 (*) 70 - 99 (mg/dL)    Comment 1 Notify RN     GLUCOSE, CAPILLARY     Status: Abnormal   Collection Time   05/20/11 11:37 AM      Component Value Range Comment   Glucose-Capillary 121 (*) 70 - 99 (mg/dL)    Comment 1 Notify RN      From May 09, 2011:  MRI HEAD  WITHOUT CONTRAST  Technique: Multiplanar, multiecho pulse sequences of the brain and  surrounding structures were obtained according to standard protocol  without intravenous contrast.  Comparison: None.  Findings: Minimal periventricular white matter change may be within  normal limits for age. No acute infarct, hemorrhage, or mass  lesion is present. The ventricles are of normal size. No  significant extra-axial fluid collection is present. Note is made  of hyperostosis frontalis internus. Flow is present in the major  intracranial arteries. The globes and orbits are intact. The  paranasal sinuses and mastoid air cells are clear. No significant  cerebral or cerebellar atrophy is evident.   IMPRESSION:  Normal MRI of the brain for age.  Treatment Plan Summary:  1. Daily contact with patient to assess and evaluate symptoms and progress in treatment  2. Medication management  3. The patient will deny suicidal ideations or homicidal ideations for 48 hours prior to discharge and have a depression and anxiety rating of 3 or less. The patient will also deny any auditory or visual hallucinations or delusional thinking.   Plan:  1. Will discontinue Abilify and start low dose Haldol at 0.5 mgs po qhs for agitation. 2. Will increase Neurontin to 300 mgs po q am, 2 pm and hs for anxiety and mood stabilization. 3. Will continue to monitor. 4. Placement needed.  Tyreon Frigon 05/20/2011, 4:51 PM

## 2011-05-20 NOTE — Progress Notes (Signed)
Patient ID: [name], female   DOB: April 24, 1956, 56 y.o.   MRN: [id number] Has been rather quiet at times, though can be intrusive as well. Came to dayroom for group and snacks, affect was flat tonight, tired.  Went to sleep initially on the floor by the door in her room because she wanted to be close to the light. Did get up and get into bed with encouragement. Will continue to monitor.

## 2011-05-20 NOTE — Progress Notes (Signed)
Recreation Therapy Notes  05/20/2011         Time: 0930      Group Topic/Focus: The focus of this group is on enhancing the patient's understanding of leisure, barriers to leisure, and the importance of engaging in positive leisure activities upon discharge for improved total health.  Participation Level: Active  Participation Quality: Intrusive and Monopolizing  Affect: Excited  Cognitive: Confused   Additional Comments: patient unable to remain on task, talking over peers, fixated on going back to [place], wants to call a Clinical research associate. Patient would respond briefly to redirection, but would be back off task a few seconds later.   [name] 05/20/2011 1:10 PM

## 2011-05-20 NOTE — Tx Team (Signed)
Interdisciplinary Treatment Plan Update (Adult)  Date:  05/20/2011  Time Reviewed:  10:14 AM   Progress in Treatment: Attending groups:  Yes Participating in groups:    Yes, but not on topic Taking medication as prescribed:    Yes Tolerating medication:   Yes Family/Significant other contact made:  No, needed Patient understands diagnosis:   No Discussing patient identified problems/goals with staff:   Yes, tangential and delusional content Medical problems stabilized or resolved:   Daily assessment, now believes is bleeding vaginally in menstrual cycle -- demanding pads with no blood production evidenced Denies suicidal/homicidal ideation:  Yes Issues/concerns per patient self-inventory:   None Other:  New problem(s) identified: Yes, Describe:  placement issue, given her symptoms and likelihood of not being coherent during interview  Reason for Continuation of Hospitalization: Delusions  Mania Medication stabilization  Interventions implemented related to continuation of hospitalization:  Medication monitoring and adjustment, safety checks Q15 min., group therapy, psychoeducation, collateral contact  Additional comments:  Not applicable  Estimated length of stay:  3-5 days  Discharge Plan:  Cannot return to daughter's, wants to go to her pastor/sister's, unknown whether she can do this.  Follow up cannot be determined until location is known.  New goal(s):  Not applicable  Review of initial/current patient goals per problem list:   1.  Goal(s):  Reduce psychotic symptoms to baseline.  Met:  No  Target date:  By Discharge   As evidenced by:  Remains grossly psychotic  2.  Goal(s):  Decide if / how to address substance abuse issues.  Met:  No  Target date:  By Discharge   As evidenced by:  Not yet decided because no decision re placement at this point  3.  Goal(s):  Find placement for patient.  Met:  No  Target date:  By Discharge   As evidenced by:  Adamant  wants to go to family member's home such as daughter or oldest sister (who is a Education officer, environmental).  Not in agreement with going to an Assisted Living Facility    Attendees: Patient:  [name]  05/20/2011 10:56 AM   Family:     Physician:  Dr. Harvie Heck Readling 05/20/2011 10:56 AM   Nursing:   Robbie Louis, RN 05/20/2011  10:56 AM    Case Manager:  Ambrose Mantle, LCSW 05/20/2011  10:56 AM   Counselor:     Other:   Lynann Bologna, NP 05/20/2011 10:56 AM   Other:      Other:      Other:       Scribe for Treatment Team:   Sarina Ser, 05/20/2011, 10:14 AM

## 2011-05-20 NOTE — Discharge Planning (Signed)
Met with patient in Aftercare Planning Group.   Remains delusional, adamant she is going to stay with family and not go to an assisted living facility.  Case Manager has not yet done FL-2 to submit to places, given likelihood she would not do well in an interview at this time.  Wants [place] to contact her sister who is a Education officer, environmental, [place], about coming to stay there.  Ambrose Mantle, LCSW 05/20/2011, 3:27 PM

## 2011-05-20 NOTE — Progress Notes (Signed)
Pt was lying on a towel in floor in the dayroom exercising upon first assessment.  First she was asked to get up and go to her room if she wanted to exercise, and then was given her self-inventory to fill out.  She did get up and let the MHT help her with her self-inventory.  She rated depression, hopelessness and anxiety all a 1 today.   She was asking if the hospital could help her get to [place].  She planned to talk with the CM in discharge planning group.  By that time, she changed her mind and was talking about getting on a [employer] bus to [place] to live with her family.  She stated,"my family told me I will have enough money for the greyhound and [employer] but not to tell the greyhound bus driver about the money for [employer]"  She has been asking for pads for her menstrual cycle and this nurse told her that we needed to see her pads to see how much she was bleeding.  When she called this nurse to the room to see, there was no blood to be seen but pt kept insisting it was.  Informed Dr. Allena Katz that pt was not really having a period as far as this nurse could tell.  Dr. Allena Katz did d/c pt's abilify and will try low dose haldol 0.5 mg tonight to see if that will help.  Pt remains intrusive and confused at times.  Unsure at this point where pt may go at discharge.  Pt is not in agreement with ALF of any type.  She wants to see id=f her sister will let her stay with her.  CM is going to try and call the sister to see.

## 2011-05-20 NOTE — Progress Notes (Signed)
Patient ID: [name], female   DOB: November 25, 1955, 56 y.o.   MRN: [id number] Pt. Irritable, reports that she did not go to group because "they starting stuff and I don't want to be in no mess" "they been stealing from me, stealing dimes for snacks and I would have give it to them if they ask me." pt. Encouraged to avoid confusion and report to staff if anyone bothers her. Pt. Agrees. Pt. Denies SHI, no AVH. Staff will continue to monitor q4min for safety.

## 2011-05-21 LAB — GLUCOSE, CAPILLARY
Glucose-Capillary: 120 mg/dL — ABNORMAL HIGH (ref 70–99)
Glucose-Capillary: 160 mg/dL — ABNORMAL HIGH (ref 70–99)

## 2011-05-21 NOTE — Progress Notes (Signed)
Patient remains fixated on going home to [place] to live in the brick house her father built when he was in the [employer].  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  Complained of vaginal itching this morning, and was quite inappropriate in the day room as she was describing her symptoms.  Requested Tylenol once for generalized discomfort.  Appetite has been good and patient has been cooperative today.  Occasionally needs redirection.

## 2011-05-21 NOTE — Progress Notes (Signed)
[facility] Group Notes:  (Counselor/Nursing/MHT/Case Management/Adjunct)  05/21/2011 1:06 PM  Type of Therapy:  Group therapy 05/20/2011 Participation Level:  Active  Participation Quality:  Intrusive and Redirectable  Affect:  Appropriate  Cognitive:  Confused  Insight:  None  Engagement in Group:  Good  Engagement in Therapy:  Good  Modes of Intervention:  Limit-setting and Support  Summary of Progress/Problems: Patient is confused but pleasant. Able to redirect.    Joene Gelder, Aram Beecham 05/21/2011, 1:06 PM

## 2011-05-21 NOTE — Discharge Planning (Signed)
Met with patient in Aftercare Planning Group.   She continues to say she will stay with family in [place], and will not go to an assisted living facility.  Case Manager called, left 3 messages with various family members asking for feedback.  Sent FL-2 to 46 facilities within 100-mile radius by [facility], then hand faxed to other facilities after calling and confirming bed existence.  No positive replies yet.  Ambrose Mantle, LCSW 05/21/2011, 4:46 PM

## 2011-05-21 NOTE — Progress Notes (Signed)
Patient ID: [name], female   DOB: [birth year], 56 y.o.   MRN: [id number] [name] waits patiently in the consult room for me while I enter an order into the computer.  She is anxious to tell me that she is going to [place], and has spoken to her sister [name], who will pick her up at the bus station. [name] says there is a group home near her in [place], and so she could go there, but for now would go home with [name].  She told Elmarie to wait inside the bus station until she got there.  [name] is concerned she needs an ID to ride the bus, which she does not have.   She denies suicidal thoughts, denies depression or hopelessness.  [name] RN reports she is still saying she is on her menses and asking for pads, but no evidence of any bleeding or discharge.   O: calm and cooperative with no dangerous ideas.  No delusional statements made during our discussion.  Goal directed with plans to go to [facility].  Doesn't want to stay in [place], feels she did the best she could for her daughter who is now a stripper, and will put concerns about her daughter's life behind her.   P: CM attempting to coordinate discharge with family.

## 2011-05-21 NOTE — Progress Notes (Signed)
[facility] Group Notes:  (Counselor/Nursing/MHT/Case Management/Adjunct)  05/21/2011 1:10 PM  Type of Therapy:  Group Therapy  Participation Level:  Active  Participation Quality:  Attentive, Intrusive and Sharing  Affect:  Appropriate  Cognitive:  Confused  Insight:  Good  Engagement in Group:  Good  Engagement in Therapy:  Good  Modes of Intervention:  Limit-setting, Orientation and Support  Summary of Progress/Problems: Patient was hyper-verbal today. She was confused and thought she was being discharged today to her family in [place].   Jacere Pangborn, Aram Beecham 05/21/2011, 1:10 PM

## 2011-05-22 LAB — GLUCOSE, CAPILLARY
Glucose-Capillary: 130 mg/dL — ABNORMAL HIGH (ref 70–99)
Glucose-Capillary: 93 mg/dL (ref 70–99)
Glucose-Capillary: 146 mg/dL — ABNORMAL HIGH (ref 70–99)

## 2011-05-22 MED ORDER — NONFORMULARY OR COMPOUNDED ITEM: 1.0000 | Freq: Once | Status: DC

## 2011-05-22 MED ORDER — TUBERCULIN PPD 5 UNIT/0.1ML ID SOLN
5.0000 [IU] | Freq: Once | INTRADERMAL | Status: AC
  Administered 2011-05-22: 5 [IU] via INTRADERMAL

## 2011-05-22 NOTE — Progress Notes (Signed)
Patient ID: [name], female   DOB: [birth year], 56 y.o.   MRN: [id number] Patient polite this evening but loud/animated. Patient fixated on receiving tylenol. Patient states that she is having cramps in lower abdomen and was pointing to lower abdomen when asked where pain was located. Went to reassess patient on pain and patient was requesting another dose, even though patient stated that first dose was successful in relieving pain some. Nurse offered patient maalox for any stomach discomfort related to indigestion but patient refused. Took all scheduled medications without incident this evening.

## 2011-05-22 NOTE — Progress Notes (Signed)
Pt visible on the unit.  Pt approaches this writer asking about getting meds and a shot.  She is already asking about snacks.  Informed pt that snacks would be available after group time.  Pt seems preoccupied with getting things, no matter what it may be.  Pt has flight of ideas in conversation.  She has no physical complaints at this time.  Safety maintained with q15 minute checks.

## 2011-05-22 NOTE — Discharge Planning (Signed)
After many phone calls and sending out FL-2 to different facilities, plus talking with nephew Thad Ranger, [phone], patient has been accepted to go to [facility], [address] [place], [place]  [address].  The facility director, Denton Lank [phone] will pick patient up on Friday prior to 12 noon.  Follow-up was arranged at [facility] in [place], on Wednesday 05/29/11 at 10:45AM with Dr. Billee Cashing.  Patient was alerted to this, and although she was anxious to go ahead and go, she was eventually willing to comply since another PPD is needed.  During Aftercare Planning Group, Case Manager provided psychoeducation on "Suicide Prevention Information."  This included descriptions of risk factors for suicide, warning signs that an individual is in crisis and thinking of suicide, and what to do if this occurs.  Pt indicated understanding of information provided, and will read brochure given upon discharge.    Ambrose Mantle, LCSW 05/22/2011, 2:36 PM

## 2011-05-22 NOTE — Progress Notes (Signed)
Pt remains about the same today.  She wrote allover her self-inventory where you really couldn't make out really anything she wrote.  She denied any depression, hopelessness or anxiety today.  She denied any S/H ideations or A/V hallucinations.  She does have a group home to go to on Friday.  She had a PPD placed today around 1400 and will need to be read on 05/24/11 around 1400.  Pt's CBG was 182 at dinner tonight and she claims she didn't eat or drink anything she wasn't suppose to have.  The MHT caught her getting a sugar soda in the cafeteria at lunch today.  Pt had been hoarding food in her room.  Staff is aware and are monitoring her closer now.

## 2011-05-22 NOTE — Progress Notes (Signed)
[facility] MD Progress Note  05/22/2011 12:38 PM  Diagnosis:  Axis I: Schizoaffective Disorder - Bipolar Type.  Alcohol Abuse.  Cannabis Abuse.   The patient was seen today and reports the following:   ADL's: Intact  Sleep: The patient reports to continuing to sleep well without difficulty.  Appetite: The patient reports a good appetite.   Mild>(1-10) >Severe  Hopelessness (1-10): 0  Depression (1-10): 0  Anxiety (1-10): 0   Suicidal Ideation: The patient adamantly denies any current suicidal ideations.  Plan: No  Intent: No  Means: No   Homicidal Ideation: The patient adamantly denies any homicidal ideations today.  Plan: No  Intent: No.  Means: No   General Appearance /Behavior: Casual.  Eye Contact: Good.  Speech: Appropriate in rate and volume but with much spontaneous speech continuing.  Motor Behavior: Mildly hyperactive.  Level of Consciousness: Alert and Oriented x 3 today.  Mental Status: Alert and oriented x 3 as above.  Mood: Mildly Manic.  Affect: Mildly Expansive.  Anxiety Level: None Reported.  Thought Process: Mild to Moderate flight of ideas.  Thought Content: No auditory or visual hallucinations reported today. The patient remains slightly confused at times today but with no delusions noted.  Perception: Mild episodic confusion which appears to be her baseline.  Judgment: Poor.  Insight: Poor.  Cognition: Orientated to time, place and person.  Sleep:  Number of Hours: 4   Vital Signs:Blood pressure 160/95, pulse 73, temperature 98.1 F (36.7 C), temperature source Oral, resp. rate 18, last menstrual period 04/19/2011, SpO2 94.00%.  Lab Results:  Results for orders placed during the hospital encounter of 05/16/11 (from the past 48 hour(s))  GLUCOSE, CAPILLARY     Status: Normal   Collection Time   05/20/11  4:50 PM      Component Value Range Comment   Glucose-Capillary 90  70 - 99 (mg/dL)   GLUCOSE, CAPILLARY     Status: Abnormal   Collection Time   05/20/11  9:37 PM      Component Value Range Comment   Glucose-Capillary 129 (*) 70 - 99 (mg/dL)   GLUCOSE, CAPILLARY     Status: Abnormal   Collection Time   05/21/11  6:17 AM      Component Value Range Comment   Glucose-Capillary 124 (*) 70 - 99 (mg/dL)    Comment 1 Notify RN     GLUCOSE, CAPILLARY     Status: Abnormal   Collection Time   05/21/11 11:41 AM      Component Value Range Comment   Glucose-Capillary 120 (*) 70 - 99 (mg/dL)   GLUCOSE, CAPILLARY     Status: Abnormal   Collection Time   05/21/11  4:54 PM      Component Value Range Comment   Glucose-Capillary 160 (*) 70 - 99 (mg/dL)   GLUCOSE, CAPILLARY     Status: Abnormal   Collection Time   05/21/11  9:32 PM      Component Value Range Comment   Glucose-Capillary 133 (*) 70 - 99 (mg/dL)   GLUCOSE, CAPILLARY     Status: Abnormal   Collection Time   05/22/11  5:47 AM      Component Value Range Comment   Glucose-Capillary 130 (*) 70 - 99 (mg/dL)   GLUCOSE, CAPILLARY     Status: Normal   Collection Time   05/22/11 11:42 AM      Component Value Range Comment   Glucose-Capillary 93  70 - 99 (mg/dL)    Comment  1 Notify RN      From May 09, 2011:  MRI HEAD WITHOUT CONTRAST  Technique: Multiplanar, multiecho pulse sequences of the brain and  surrounding structures were obtained according to standard protocol  without intravenous contrast.  Comparison: None.  Findings: Minimal periventricular white matter change may be within  normal limits for age. No acute infarct, hemorrhage, or mass  lesion is present. The ventricles are of normal size. No  significant extra-axial fluid collection is present. Note is made  of hyperostosis frontalis internus. Flow is present in the major  intracranial arteries. The globes and orbits are intact. The  paranasal sinuses and mastoid air cells are clear. No significant  cerebral or cerebellar atrophy is evident.   IMPRESSION:  Normal MRI of the brain for age.   Time was spent today  discussing with the patient the new ALF in [place], [place] which is near her family.  The patient states that she is excited about this and is hoping to be able to do some cleaning chores at the ALF to "make a little spending money."  Overall the patient reports to doing well with no complaints.  Treatment Plan Summary:  1. Daily contact with patient to assess and evaluate symptoms and progress in treatment  2. Medication management  3. The patient will deny suicidal ideations or homicidal ideations for 48 hours prior to discharge and have a depression and anxiety rating of 3 or less. The patient will also deny any auditory or visual hallucinations or delusional thinking.   Plan:  1. Will continue the patient's current medications. 2. Will order a PPD today which is required to rule out TB prior to her placement at her new ALF on Friday. 3. Will continue to monitor.  4. Placement located with the patient being discharged this Friday to travel to [place] to enter the facility which is close to her family.  Valery Chance 05/22/2011, 12:38 PM

## 2011-05-22 NOTE — Progress Notes (Signed)
Recreation Therapy Notes  05/22/2011         Time: 0930      Group Topic/Focus: The focus of this group is on discussing various styles of communication and communicating assertively using 'I' (feeling) statements.  Participation Level: None  Participation Quality: Resistant  Affect: Labile  Cognitive: Confused   Additional Comments: Patient initially declined group, but came in a few minutes later. Patient says her "Chinese foot" is bothering her today; Linzee is walking around with no socks. Patient continued to refuse to participate in the activity, but would interrupt RT to talk. RT set limits, encouraged patient to keep her non-group related conversation for the end. 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   Ervey Fallin 05/22/2011 11:51 AM

## 2011-05-23 LAB — GLUCOSE, CAPILLARY
Glucose-Capillary: 85 mg/dL (ref 70–99)
Glucose-Capillary: 124 mg/dL — ABNORMAL HIGH (ref 70–99)
Glucose-Capillary: 110 mg/dL — ABNORMAL HIGH (ref 70–99)

## 2011-05-23 MED ORDER — NICOTINE 7 MG/24HR TD PT24
7.0000 mg | MEDICATED_PATCH | Freq: Every day | TRANSDERMAL | Status: DC
  Filled 2011-05-23 (×2): qty 5

## 2011-05-23 MED ORDER — HALOPERIDOL 0.5 MG PO TABS
0.5000 mg | ORAL_TABLET | Freq: Every day | ORAL | Status: AC
Start: 2011-05-23 — End: 2011-06-22

## 2011-05-23 MED ORDER — TRAZODONE HCL 150 MG PO TABS: 150.0000 mg | ORAL_TABLET | Freq: Every day | ORAL | Status: DC

## 2011-05-23 MED ORDER — TRAZODONE HCL 50 MG PO TABS
150.0000 mg | ORAL_TABLET | Freq: Every day | ORAL | Status: DC
  Filled 2011-05-23: qty 15

## 2011-05-23 MED ORDER — HALOPERIDOL 0.5 MG PO TABS
0.5000 mg | ORAL_TABLET | Freq: Every day | ORAL | Status: DC
  Filled 2011-05-23: qty 14

## 2011-05-23 MED ORDER — NICOTINE 7 MG/24HR TD PT24: 1.0000 | MEDICATED_PATCH | Freq: Every day | TRANSDERMAL | Status: AC

## 2011-05-23 MED ORDER — GABAPENTIN 300 MG PO CAPS: 300.0000 mg | ORAL_CAPSULE | ORAL | Status: DC

## 2011-05-23 MED ORDER — CLONAZEPAM 0.5 MG PO TABS: 0.5000 mg | ORAL_TABLET | ORAL | Status: AC

## 2011-05-23 MED ORDER — CLONAZEPAM 0.5 MG PO TABS
0.5000 mg | ORAL_TABLET | ORAL | Status: DC
  Filled 2011-05-23: qty 15

## 2011-05-23 MED ORDER — GABAPENTIN 300 MG PO CAPS
300.0000 mg | ORAL_CAPSULE | ORAL | Status: DC
  Filled 2011-05-23 (×2): qty 15

## 2011-05-23 MED ORDER — DONEPEZIL HCL 5 MG PO TABS: 5.0000 mg | ORAL_TABLET | Freq: Every day | ORAL | Status: DC

## 2011-05-23 MED ORDER — LISINOPRIL 20 MG PO TABS: 20.0000 mg | ORAL_TABLET | Freq: Every day | ORAL | Status: DC

## 2011-05-23 MED ORDER — LISINOPRIL 20 MG PO TABS
20.0000 mg | ORAL_TABLET | Freq: Every day | ORAL | Status: DC
  Filled 2011-05-23 (×2): qty 5

## 2011-05-23 NOTE — Progress Notes (Signed)
Patient childlike and loud this evening, often needs to be redirected. Patient just came up to medication window, about 20 minutes after receiving HS medications, stating that she does not want to take one of her medications because it makes her 'throw up.' Asked patient which medication she is referring to and patient would not tell nurse, stating 'it doesn't matter because I took it already.' Expressed to patient that if she is vomiting that she needs to alert staff and then patient walked away from the window to the dayroom. Patient has been intrusive into other patient's personal space and needs redirections. Has been talking on the unit about discharge tomorrow. Patient labile in mood, laughing and joking to becoming angry and irritable.

## 2011-05-23 NOTE — Discharge Summary (Signed)
  Identifying information: This is a 56 year old [place] American female, this was a voluntary admission.  Date of admission: 05/16/2011. Date of discharge: 05/24/2011.  Discharge diagnoses: Axis I: Schizoaffective disorder, bipolar type. Alcohol abuse. Cannabis abuse. Axis II: Mental retardation. Axis III: Diabetes mellitus type 2, stable and diet control. Recent pancreatitis resolved, Recent URI resolved. Axis IV: Homelessness, resolved. Axis V: Current 55 past year 55 estimated.  Discharge medications: Aricept 5 mg daily at bedtime for clear thoughts and calm mood. Klonopin 0.5 mg 3 times daily at 8 AM, 2 PM, and bedtime, for anxiety. Trazodone 150 mg daily at bedtime, for sleep. Haldol 0.5 mg tablet daily at bedtime. For clear thoughts. Gabapentin 300 mg 3 times daily at 8 AM, 2 PM, and bedtime, for anxiety.  Note: This patient is not being discharged on 2 antipsychotics.  Pertinent Diagnostic Studies: B12 Level 618, HbgA1C 6.4, and an MRI was performed 05/10/11 to rule out organic problems and was noted to be completely normal for her age.   Course of hospitalization: Lorelle Formosa originally presented in our emergency room on December 31 after being evicted from her daughter's house, she had been abusing alcohol and marijuana. She presented to agitated with paranoid thoughts and was referred for stabilization. She responded well to medications. she was transferred there for a brief stay to internal medicine for an upper respiratory infection and nausea and vomiting which was eventually diagnosed as pancreatitis. These illnesses have completely resolved and she returned to the psychiatric unit on January 10.  Today she is calm and cooperative, and directable. She participates in group therapy and unit activities with minimal direction and prompting. She is sleeping well through the night on the current medications. She has no suicidal thoughts or dangerous thoughts and interacts well with  peers.  Her case manager has spoken with her family we've made arrangements for her to be placed in a group home [place]. She will followup with Dr. Billee Cashing of [facility] psychiatric services, and [facility] for January 23.  Keaisha was noted to be intolerant of metformin which was prescribed to her for some elevated blood sugar. Her glucose levels have been stable without typically in the 100-133 range in the early morning, and she has not required to insulin or any additional medications. We recommended she continue on a carbohydrate controlled diet, and avoid concentrated sweets.

## 2011-05-23 NOTE — Progress Notes (Signed)
[facility] MD Progress Note  05/23/2011 1:41 PM  Diagnosis:  Axis I: Schizoaffective Disorder - Bipolar Type.  Alcohol Abuse.  Cannabis Abuse.   The patient was seen today and reports the following:   ADL's: Intact  Sleep: The patient reports to sleeping well. Appetite: The patient reports a good appetite.   Mild>(1-10) >Severe  Hopelessness (1-10): 0  Depression (1-10): 0  Anxiety (1-10): 0   Suicidal Ideation: The patient adamantly denies any current suicidal ideations.  Plan: No  Intent: No  Means: No   Homicidal Ideation: The patient adamantly denies any homicidal ideations today.  Plan: No  Intent: No.  Means: No   General Appearance /Behavior: Casual.  Eye Contact: Good.  Speech: Appropriate in rate and volume but with ongoing much spontaneous speech continuing.  Motor Behavior: Mildly hyperactive.  Level of Consciousness: Alert and Oriented x 3 today.  Mental Status: Alert and oriented x 3 as above.  Mood: Mildly Manic.  Affect: Mildly Expansive.  Anxiety Level: None Reported.  Thought Process: Mild flight of ideas.  Thought Content: No auditory or visual hallucinations reported today. The patient remains slightly confused at times but with no delusions noted.  Perception: Mild episodic confusion which appears to be her baseline.  Judgment: Poor.  Insight: Poor.  Cognition: Orientated to time, place and person.  Sleep:  Number of Hours: 3.5   Vital Signs:Blood pressure 146/90, pulse 94, temperature 98.2 F (36.8 C), temperature source Oral, resp. rate 16, last menstrual period 04/19/2011, SpO2 94.00%.  Lab Results:  Results for orders placed during the hospital encounter of 05/16/11 (from the past 48 hour(s))  GLUCOSE, CAPILLARY     Status: Abnormal   Collection Time   05/21/11  4:54 PM      Component Value Range Comment   Glucose-Capillary 160 (*) 70 - 99 (mg/dL)   GLUCOSE, CAPILLARY     Status: Abnormal   Collection Time   05/21/11  9:32 PM      Component  Value Range Comment   Glucose-Capillary 133 (*) 70 - 99 (mg/dL)   GLUCOSE, CAPILLARY     Status: Abnormal   Collection Time   05/22/11  5:47 AM      Component Value Range Comment   Glucose-Capillary 130 (*) 70 - 99 (mg/dL)   GLUCOSE, CAPILLARY     Status: Normal   Collection Time   05/22/11 11:42 AM      Component Value Range Comment   Glucose-Capillary 93  70 - 99 (mg/dL)    Comment 1 Notify RN     GLUCOSE, CAPILLARY     Status: Abnormal   Collection Time   05/22/11  5:09 PM      Component Value Range Comment   Glucose-Capillary 182 (*) 70 - 99 (mg/dL)   GLUCOSE, CAPILLARY     Status: Abnormal   Collection Time   05/22/11  9:01 PM      Component Value Range Comment   Glucose-Capillary 138 (*) 70 - 99 (mg/dL)   GLUCOSE, CAPILLARY     Status: Abnormal   Collection Time   05/22/11  9:29 PM      Component Value Range Comment   Glucose-Capillary 146 (*) 70 - 99 (mg/dL)   GLUCOSE, CAPILLARY     Status: Abnormal   Collection Time   05/23/11  5:54 AM      Component Value Range Comment   Glucose-Capillary 139 (*) 70 - 99 (mg/dL)    Comment 1 Notify RN  GLUCOSE, CAPILLARY     Status: Normal   Collection Time   05/23/11 11:36 AM      Component Value Range Comment   Glucose-Capillary 85  70 - 99 (mg/dL)    From May 09, 2011:  MRI HEAD WITHOUT CONTRAST  Technique: Multiplanar, multiecho pulse sequences of the brain and  surrounding structures were obtained according to standard protocol  without intravenous contrast.  Comparison: None.  Findings: Minimal periventricular white matter change may be within  normal limits for age. No acute infarct, hemorrhage, or mass  lesion is present. The ventricles are of normal size. No  significant extra-axial fluid collection is present. Note is made  of hyperostosis frontalis internus. Flow is present in the major  intracranial arteries. The globes and orbits are intact. The  paranasal sinuses and mastoid air cells are clear. No significant   cerebral or cerebellar atrophy is evident.   IMPRESSION:  Normal MRI of the brain for age.   Time was spent today discussing with the patient her plans for discharge tomorrow.  She states she is looking forward to being discharged to the ALF and hopes to be able to work some to provide spending money.  She remains pleasant and cooperative.  Treatment Plan Summary:  1. Daily contact with patient to assess and evaluate symptoms and progress in treatment  2. Medication management  3. The patient will deny suicidal ideations or homicidal ideations for 48 hours prior to discharge and have a depression and anxiety rating of 3 or less. The patient will also deny any auditory or visual hallucinations or delusional thinking.   Plan:  1. Will continue the patient's current medications.  2. Will continue to monitor.  3. Placement located with the patient being discharged tomorrow to travel to [place] to enter an ALF which is close to her family.  [name] 05/23/2011, 1:41 PM

## 2011-05-23 NOTE — Discharge Planning (Signed)
Met with patient in Aftercare Planning Group.   She is fully aware of plans for her to discharge tomorrow to group home in [facility], and she is excited about this.  She knows how a group home works, and states that she will not be left alone.  She asked about her follow-up, and Case Manager informed her she will follow up with the same facility, [facility], where she used to go when at the other group home.  Patient is simply asking for an "old pocketbook" to put her papers into at this point.  She has asked repeatedly since yesterday.  Case Manager will search for one.  It is known that various staff members have already supplied her with numerous pieces of clothing and shoes.  Ambrose Mantle, LCSW 05/23/2011, 9:37 AM

## 2011-05-23 NOTE — Progress Notes (Addendum)
Pt was in dayroom upon first assessment.  She was given her self-inventory to fill out and again today she wrote all over the paper but not the correct replies.  She did deny any depression, hopelessness or anxiety today.  She did say that she felt a little anxious and ready for tomorrow to come so she could leave.  She remains intrusive and  Needy.  She asks often for different things.  Sometimes it is for more clothes out of the closet here, toiletries or snack.  She does act very child-like at times.  She has to be constantly redirected on the unit.  She is going to a group home tomorrow in [facility] and the group home plans to pick her up.

## 2011-05-24 LAB — GLUCOSE, CAPILLARY: Glucose-Capillary: 140 mg/dL — ABNORMAL HIGH (ref 70–99)

## 2011-05-24 NOTE — Progress Notes (Signed)
[facility] Group Notes:  (Counselor/Nursing/MHT/Case Management/Adjunct)  05/24/2011 11:10 AM  Type of Therapy:  Group Therapy 05/23/2011  Participation Level:  Active  Participation Quality:  Attentive, Intrusive and Sharing  Affect:  Appropriate  Cognitive:  Confused  Insight:  Limited  Engagement in Group:  Good  Engagement in Therapy:  Good  Modes of Intervention:  Orientation and Support  Summary of Progress/Problems: Patient is excited about going to group home. She is oriented on some things but continues to have some confusion.   Berlie Hatchel, Aram Beecham 05/24/2011, 11:10 AM

## 2011-05-24 NOTE — Progress Notes (Signed)
Pt is pleasant and cooperative. Pt D/C to group home. Pt follow-up and rx reviewed with pt and group home worker. Pt and group home worker verbalized understanding. Pt denies SI/HI. Pt belongings were returned.

## 2011-05-24 NOTE — Tx Team (Signed)
Interdisciplinary Treatment Plan Update (Adult)  Date:  05/24/2011  Time Reviewed:  10:30 AM   Progress in Treatment: Attending groups:  Yes Participating in groups:    Yes Taking medication as prescribed:    Yes Tolerating medication:   Yes Family/Significant other contact made:  Yes Patient understands diagnosis:   Yes Discussing patient identified problems/goals with staff:   Yes Medical problems stabilized or resolved:   Yes Denies suicidal/homicidal ideation:  Yes Issues/concerns per patient self-inventory:   Yes Other:  New problem(s) identified: No, Describe:    Reason for Continuation of Hospitalization: None  Interventions implemented related to continuation of hospitalization:  Medication monitoring and adjustment, safety checks Q15 min., group therapy, psychoeducation, collateral contact - until discharge  Additional comments:  Not applicable  Estimated length of stay:  Discharge today  Discharge Plan:  Go to [facility] in [place], follow up at [facility], set.  New goal(s):  Not applicable  Review of initial/current patient goals per problem list:   1.  Goal(s):  Reduce psychotic symptoms to baseline.  Met:  Yes  Target date:  By Discharge   As evidenced by:  No psychosis noted, impulsive behaviors are MR-related  2.  Goal(s):  Decide if / how to address substance abuse issues.  Met:  No  Target date:  By Discharge   As evidenced by:  Will be addressed by group home and new psychiatrist (returning to this place from the past)  3.  Goal(s):  Find placement for patient.  Met:  Yes  Target date:  By Discharge   As evidenced by:  [facility]    Attendees: Patient:  [name]  05/24/2011 11:12 AM   Family:     Physician:  Dr. Harvie Heck Readling 05/24/2011 11:12 AM   Nursing:   Omelia Blackwater, RN 05/24/2011  11:12 AM    Case Manager:  Ambrose Mantle, LCSW 05/24/2011  11:12 AM   Counselor:  Veto Kemps, MT-BC  05/24/2011  11:12 AM   Other:      Other:      Other:      Other:       Scribe for Treatment Team:   Sarina Ser, 05/24/2011, 10:30 AM

## 2011-05-24 NOTE — Progress Notes (Signed)
Recreation Therapy Notes  05/24/2011         Time: 1100      Group Topic/Focus: The focus of the group is on enhancing the patients' ability to cope with stressors by understanding what coping is, why it is important, the negative effects of stress and developing healthier coping skills. Patients practice [place] and discuss how exercise can be used as a healthy coping strategy.  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 When RT challenged patient, she reported she was first going to the group home, then to [facility]. Patient left group early to reportedly use the restroom, but didn't return.   [name] 05/24/2011 11:58 AM

## 2011-05-24 NOTE — Progress Notes (Signed)
Sleeping at brief intervals.  When awake, she exhibits untrusting behaviors and paranoid thinking.  Contends, "there's a man out there by the window---trying to get in here---did you hear him talking?  My stomach hurts---they gave me steel pills---I know what they are trying to do."  Friendly smile when distracted but  immediately returns to illogical verbalizations.  When Tylenol administered earlier, she asked and did  examine several unopened packages of the preparation before deciding which she would take.  Will monitor closely for vomiting which she claims to have experienced  last evening and also will report ongoing C/O abdominal pain to next shift staff.

## 2011-05-24 NOTE — Progress Notes (Signed)
Adult Services Patient-Family Contact/Session  Attendees:  [name], [name]  Goal(s):  Returned call, discussed discharge planning  Safety Concerns:  Didn't like the group home selected  Narrative:  Talked to patient's daughter on 05/20/2011. She was upset because she did not have a say so in which group home was selected for patient. She stated family members that staff had worked with had not been in contact with patient. She stated that she had been working [employer] but was running into roadblocks. Told her that patient was being discharged on Friday and that group home in [facility] has accepted her. She was argumentative and counselor reminded her that she had taken her out of her group home earlier. This group home liked patient and would have taken her back but there was no bed. She didn't like that group home because they were letting patient have sex with other patients. She took her out but realized that she could not keep her at home.  Offered daughter the opportunity to come up with a different placement that she thought was more appropriate and to inform us of that decision before Friday.   Barrier(s):  Daughter's lack of insight in placement  Interventions:  Discussed discharge planning  Recommendation(s):  Group home placement, medications  Follow-up Required:  No  Explanation:    [name], Aram Beecham 05/24/2011, 11:14 AM

## 2011-05-24 NOTE — Progress Notes (Signed)
Suicide Risk Assessment  Discharge Assessment     Demographic factors:  Assessment Details Time of Assessment: Admission Information Obtained From: Patient Current Mental Status:  Current Mental Status: Self-harm thoughts Risk Reduction Factors:  Risk Reduction Factors: Living with another person, especially a relative  CLINICAL FACTORS:   Alcohol/Substance Abuse/Dependencies Previous Psychiatric Diagnoses and Treatments Medical Diagnoses and Treatments/Surgeries  COGNITIVE FEATURES THAT CONTRIBUTE TO RISK:  Loss of executive function    Diagnosis:   Axis I: Schizoaffective Disorder - Bipolar Type.  Alcohol Abuse.  Cannabis Abuse.   The patient was seen today and reports the following:   ADL's: Intact  Sleep: The patient reports to sleeping well.  Appetite: The patient reports a good appetite.   Mild>(1-10) >Severe  Hopelessness (1-10): 0  Depression (1-10): 0  Anxiety (1-10): 0   Suicidal Ideation: The patient adamantly denies any current suicidal ideations.  Plan: No  Intent: No  Means: No   Homicidal Ideation: The patient adamantly denies any homicidal ideations today.  Plan: No  Intent: No.  Means: No   General Appearance /Behavior: Casual.  Eye Contact: Good.  Speech: Appropriate in rate and volume but with ongoing much spontaneous speech continuing.  Motor Behavior: Mildly hyperactive.  Level of Consciousness: Alert and Oriented x 3 today.  Mental Status: Alert and oriented x 3 as above.  Mood: Mildly Manic.  Affect: Mildly Expansive.  Anxiety Level: None Reported.  Thought Process: Mild flight of ideas.  Thought Content: No auditory or visual hallucinations reported today. The patient remains slightly confused at times but with no delusions noted.  Perception: Mild episodic confusion which appears to be her baseline.  Judgment: Poor.  Insight: Poor.  Cognition: Orientated to time, place and person.   From May 09, 2011:  MRI HEAD WITHOUT CONTRAST   Technique: Multiplanar, multiecho pulse sequences of the brain and  surrounding structures were obtained according to standard protocol  without intravenous contrast.  Comparison: None.  Findings: Minimal periventricular white matter change may be within  normal limits for age. No acute infarct, hemorrhage, or mass  lesion is present. The ventricles are of normal size. No  significant extra-axial fluid collection is present. Note is made  of hyperostosis frontalis internus. Flow is present in the major  intracranial arteries. The globes and orbits are intact. The  paranasal sinuses and mastoid air cells are clear. No significant  cerebral or cerebellar atrophy is evident.  IMPRESSION:  Normal MRI of the brain for age.   Time was spent today discussing with the patient her plans for discharge today. The patient reports that she is packed and is looking forward to going.  Overall she states she is happy with her treatment at [facility] and is appreciative of the care given.  Treatment Plan Summary:  1. Daily contact with patient to assess and evaluate symptoms and progress in treatment  2. Medication management  3. The patient will deny suicidal ideations or homicidal ideations for 48 hours prior to discharge and have a depression and anxiety rating of 3 or less. The patient will also deny any auditory or visual hallucinations or delusional thinking.   Plan:  1. Will continue the patient's current medications.  2. Will continue to monitor.  3. Placement located with the patient being discharged today to travel to her ALF.  SUICIDE RISK:   Minimal: No identifiable suicidal ideation.  Patients presenting with no risk factors but with morbid ruminations; may be classified as minimal risk based on the  severity of the depressive symptoms  [name] 05/24/2011, 11:39 AM

## 2011-05-24 NOTE — Progress Notes (Signed)
[facility] Group Notes:  (Counselor/Nursing/MHT/Case Management/Adjunct)  05/24/2011 11:11 AM  Type of Therapy:  Group Therapy  Participation Level:  Active  Participation Quality:  Attentive and Sharing  Affect:  Appropriate  Cognitive:  Confused  Insight:  Limited  Engagement in Group:  Good  Engagement in Therapy:  Good  Modes of Intervention:  Education and Support  Summary of Progress/Problems: Patient continues to be eager to go to group home. She says they are coming to get her at 11:00. She stated that they have told her to take her medicines and go to her appointments, which she plans to do.   Windie Marasco, Aram Beecham 05/24/2011, 11:11 AM

## 2011-05-24 NOTE — Progress Notes (Signed)
[facility] Case Management Discharge Plan:  Will you be returning to the same living situation after discharge: No.  New group home found. At discharge, do you have transportation home?:Yes,  Group home picking up Do you have the ability to pay for your medications:Yes,  insurance and income  Interagency Information:     Release of information consent forms completed and in the chart;  Patient's signature needed at discharge.  Patient to Follow up at:  Follow-up Information    Follow up with Dr. Billee Cashing @ [facility] on 05/29/2011. (10:45 AM appointment)    Contact information:   [facility] [address] [place], [place]  [address] Telephone:  [phone]          Patient denies SI/HI:   Yes,      Safety Planning and Suicide Prevention discussed:  Yes,    During Aftercare Planning Groups, Case Manager has provided psychoeducation on "Suicide Prevention Information."  This included descriptions of risk factors for suicide, warning signs that an individual is in crisis and thinking of suicide, and what to do if this occurs.  Pt indicated understanding of information provided, and will read brochure given upon discharge.     Barrier to discharge identified:No.  Summary and Recommendations:  None - patient is stable for discharge   Sarina Ser 05/24/2011, 11:12 AM

## 2011-05-24 NOTE — Progress Notes (Signed)
[facility] Group Notes:  (Counselor/Nursing/MHT/Case Management/Adjunct)  05/24/2011 11:08 AM  Type of Therapy:  Psychoeducational Skills 05/22/2011  Participation Level:  Active  Patient was attentive to speaker from mental health association. She had to be redirected several times but responded positively. Attentive to information and was asking if there were any services where she was going.  Naama Sappington, Aram Beecham 05/24/2011, 11:08 AM

## 2011-05-27 NOTE — Progress Notes (Signed)
Patient Discharge Instructions:  Admission Note Faxed,  05/27/2011 After Visit Summary Faxed,  05/27/2011 Faxed to the Next Level Care provider:  05/27/2011 D/C Summary faxed 05/27/2011 [place] faxed 05/27/2011   Faxed to [facility] - Dr. Carollee Herter @ [phone]  Heloise Purpura Eduard Clos, 05/27/2011, 5:33 PM

## 2011-06-02 NOTE — Progress Notes (Signed)
Patient ID: [name], female   DOB: Nov 13, 1955, 56 y.o.   MRN: [id number] [name] is a 56 y.o. female  [id number]  20-Sep-1955  05/16/2011  Active Problems:  * No active hospital problems. *  Schizoaffective -Bipolar  Mental Status  :alert & oriented -enjoying karaoke. Says the meds are the good stuff-keep her smooth. Denies SI/HI/AVH. Mood is friendly and she smiles easily.  Says she can go to her family in [place] at discharge. IQ is baseline to below normal.  Subjective/Objective:  Originally admitted 05/07/11- sent to ED for evaluation of abdominal pain 1/4 medically stabilized and ready for transfer back to [facility] 1/6 but no bed until now.  Filed Vitals:    05/16/11 1701   BP:  164/100   Pulse:  75   Temp:  98.6 F (37 C)   Resp:  20    Lab Results:   BMET    Component  Value  Date/Time    NA  139  05/16/2011 0525    K  4.1  05/16/2011 0525    CL  101  05/16/2011 0525    CO2  29  05/16/2011 0525    GLUCOSE  109*  05/16/2011 0525    BUN  19  05/16/2011 0525    CREATININE  1.28*  05/16/2011 0525    CALCIUM  10.1  05/16/2011 0525    GFRNONAA  46*  05/16/2011 0525    GFRAA  53*  05/16/2011 0525    Medications:  Scheduled:   .  ARIPiprazole  5 mg  Oral  BID   .  clonazePAM  0.5 mg  Oral  [id number]   .  gabapentin  200 mg  Oral  [id number]   .  lisinopril  10 mg  Oral  Daily   .  nicotine  7 mg  Transdermal  Daily   .  predniSONE  40 mg  Oral  QAC breakfast   .  traZODone  150 mg  Oral  QHS    PRN Meds  acetaminophen, albuterol, alum & mag hydroxide-simeth, magnesium hydroxide, menthol-cetylpyridinium  Plan: continue current treatment plan.  Case manager to start discharge plans.  Leshae Mcclay,MICKIE D.

## 2013-10-30 IMAGING — CT CT ABD-PELV W/ CM
1 of 3 series · 14 of 32 positions shown, 19 images · IV contrast (100 ML OMNI 300)
Comparison: Abdomen films of 05/10/2011

CLINICAL DATA: Nausea, vomiting, diarrhea, prior history of
pancreatitis

CT ABDOMEN AND PELVIS WITH CONTRAST
TECHNIQUE: Multidetector CT imaging of the abdomen and pelvis was
performed following the standard protocol during bolus
administration of intravenous contrast.
Contrast: 100mL OMNIPAQUE IOHEXOL 300 MG/ML IV SOLN

[Series 2: abd/pel with · axial · 0.77mm/px · z∈[-366,-16]mm · 14 of 80 slices shown, 19 images]
[im 5/80  soft-tissue]
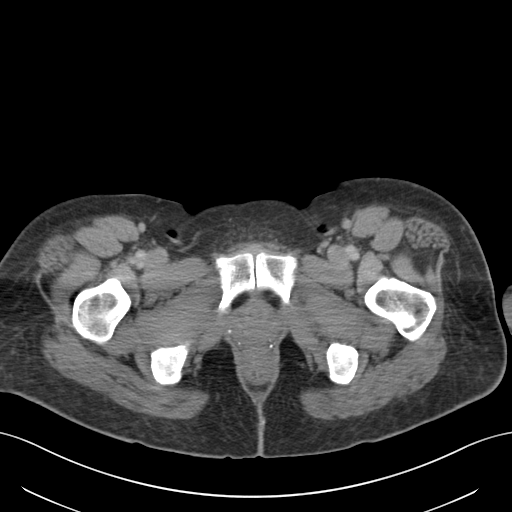
[im 5/80  bone]
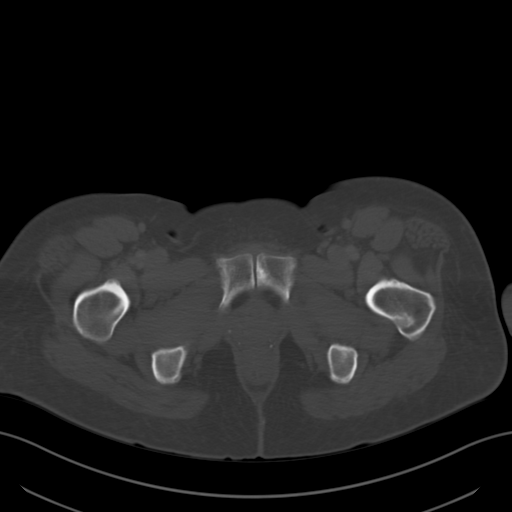
[im 13/80  soft-tissue]
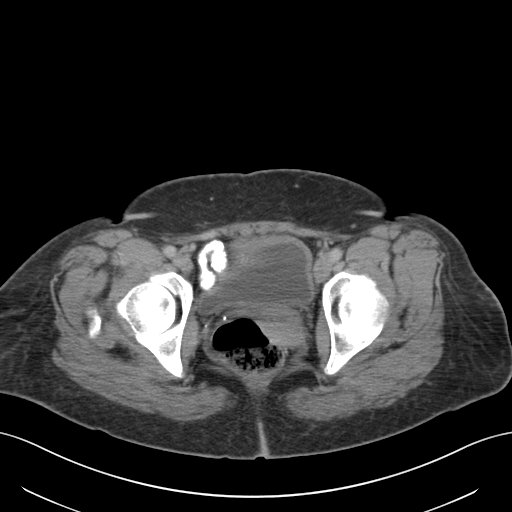
[im 17/80  soft-tissue]
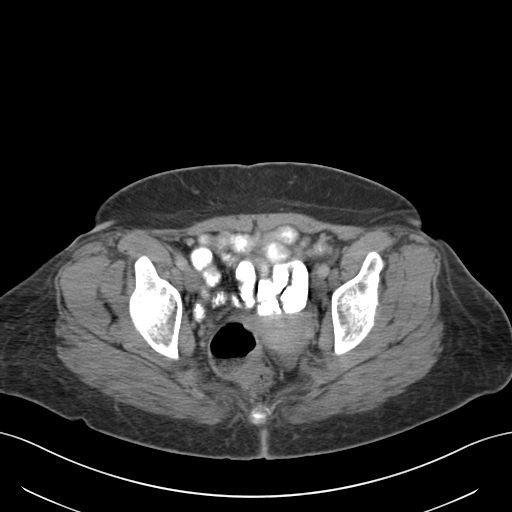
[im 21/80  soft-tissue]
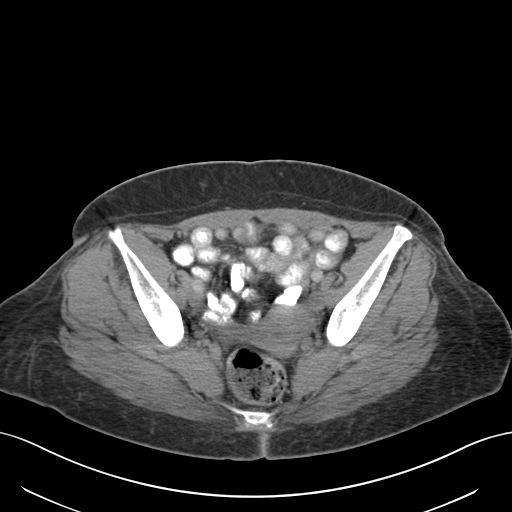
[im 30/80  soft-tissue]
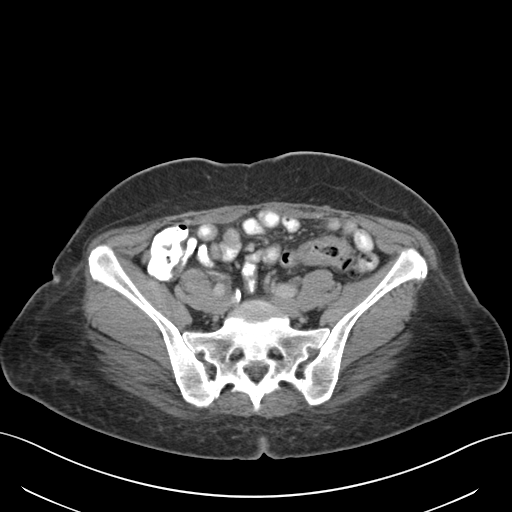
[im 34/80  soft-tissue]
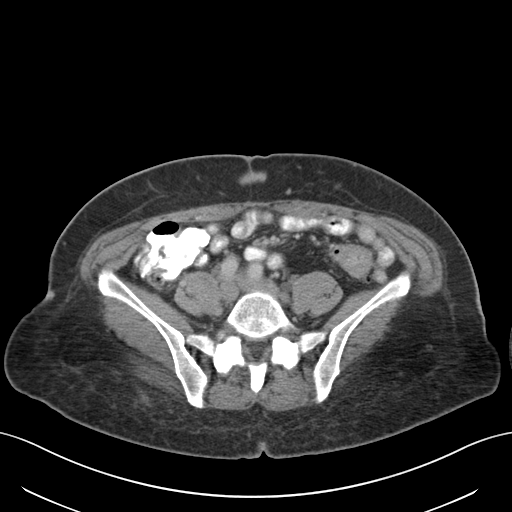
[im 42/80  soft-tissue]
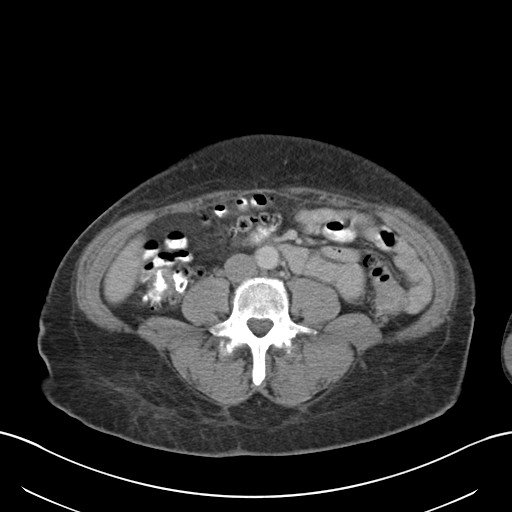
[im 46/80  soft-tissue]
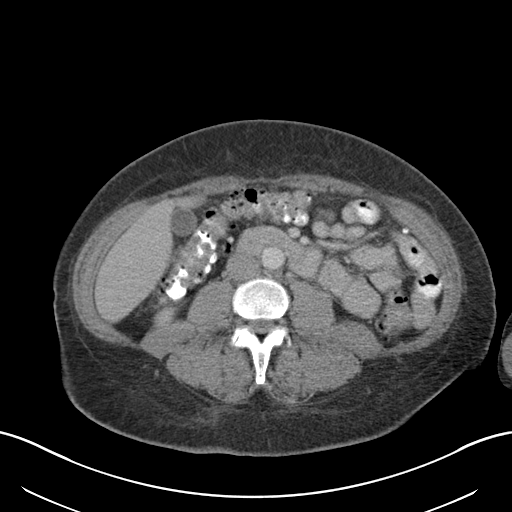
[im 50/80  soft-tissue]
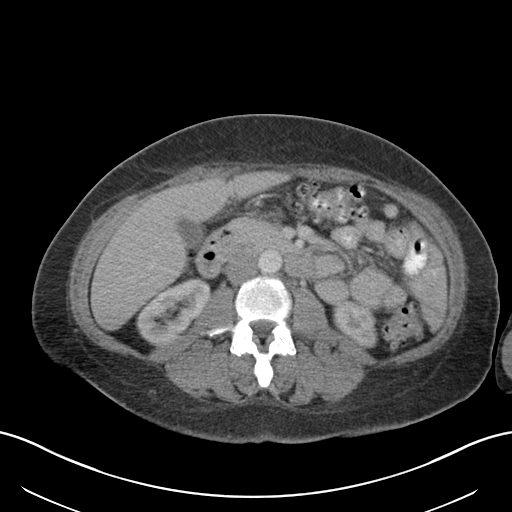
[im 50/80  bone]
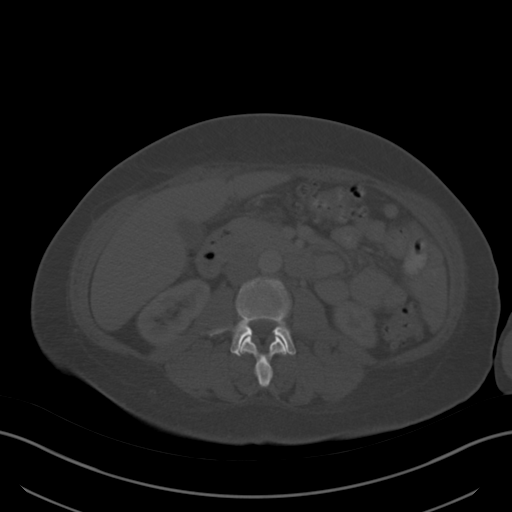
[im 59/80  soft-tissue]
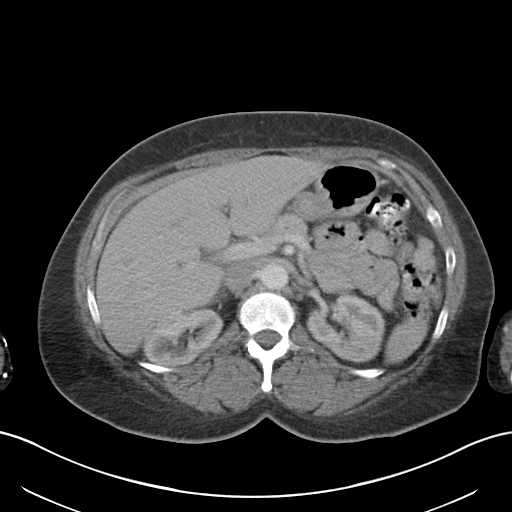
[im 63/80  soft-tissue]
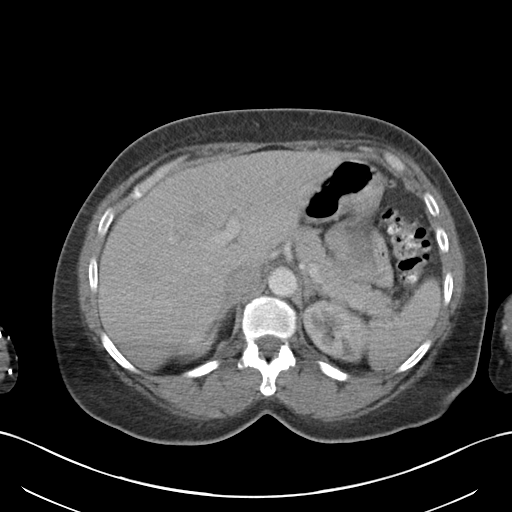
[im 63/80  lung]
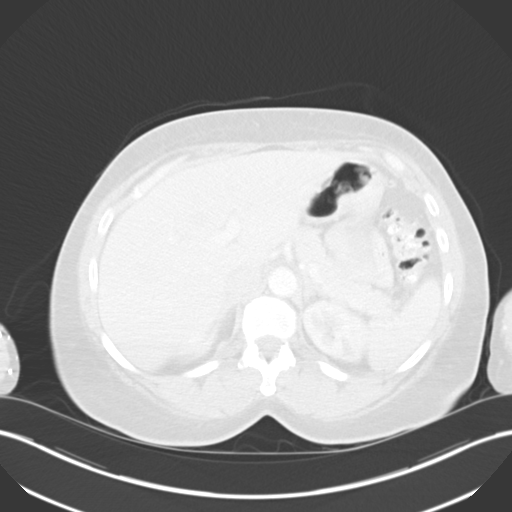
[im 67/80  soft-tissue]
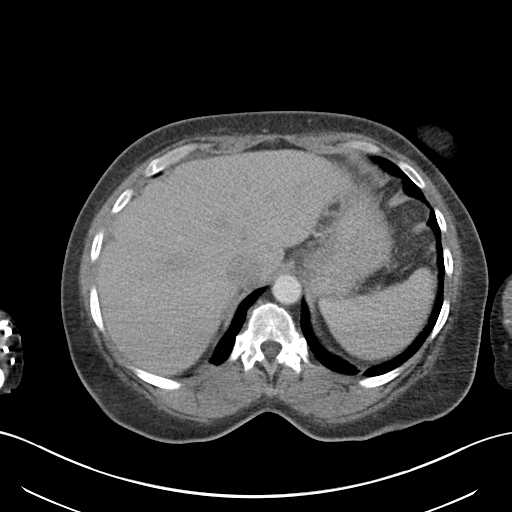
[im 67/80  lung]
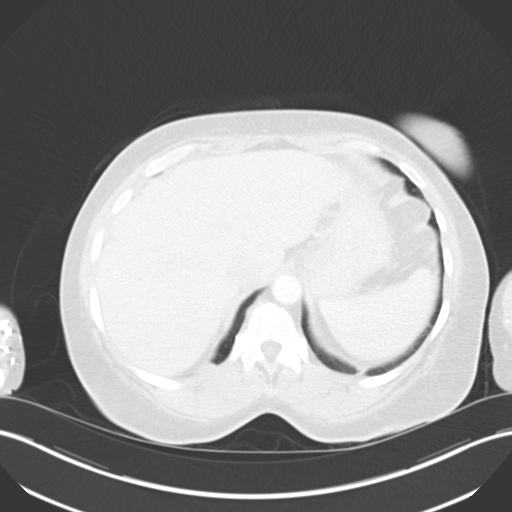
[im 71/80  lung]
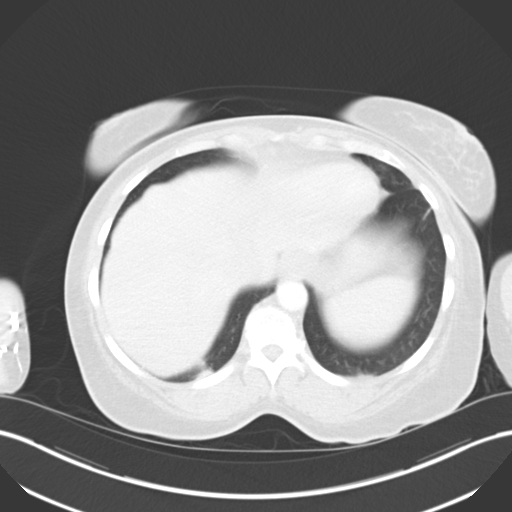
[im 75/80  soft-tissue]
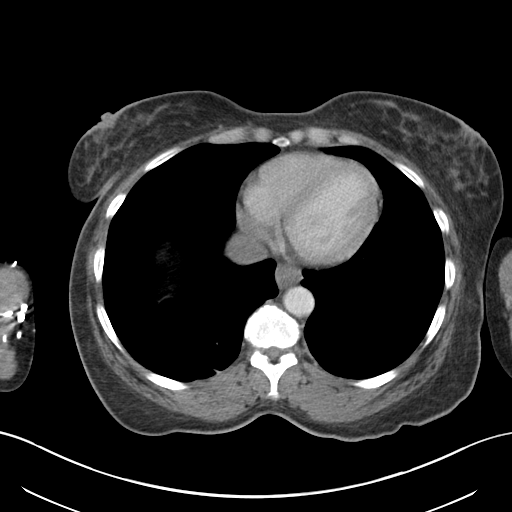
[im 75/80  lung]
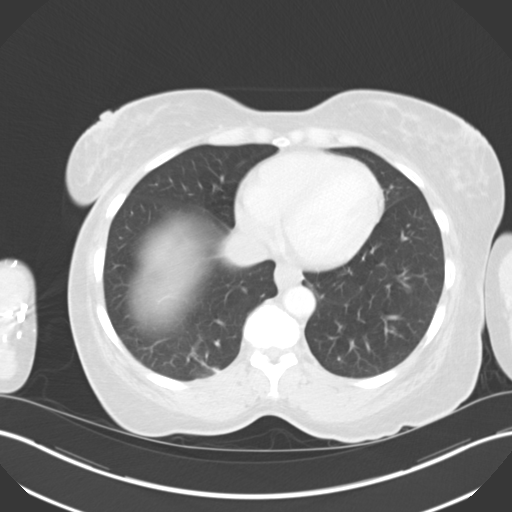

[14 of 32 positions shown; findings below may reference images not displayed]

FINDINGS: Linear atelectasis or scarring is noted at the lung
bases.  The liver enhances with no focal abnormality and no ductal
dilatation is seen.  The gallbladder is somewhat contracted and no
calcified gallstones are noted.  The pancreas is unremarkable and
the pancreatic duct is not dilated.  There is a small low
attenuation structure near the tail of the pancreas which may
represent a pseudocyst of 13 mm in diameter.  The adrenal glands
and spleen are unremarkable.  The stomach is not distended.  The
kidneys enhance with no definite abnormality.  There is low
attenuation area in the upper pole of the left kidney which is
indeterminate, better seen on coronal images and follow-up is
recommended.  The abdominal aorta is normal in caliber.  No
adenopathy is seen.

The uterus is normal in size.  No adnexal lesion is seen.  Only a
tiny amount of free fluid is noted within the pelvis.  The urinary
bladder is not well distended and is slightly thick-walled.
Cystitis cannot be excluded.  Diverticula scattered diffusely
throughout the colon but no diverticulitis is seen.  The terminal
ileum is unremarkable.  No bony abnormality is seen.
IMPRESSION: 1.  No evidence of acute pancreatitis.
2.  Small low attenuation structure near the tail of the pancreas
may represent a 13 mm pseudocyst.
3.  Small indeterminate lesion in the upper pole of the left
kidney.  Consider follow-up.
4.  Multiple colonic diverticula diffusely.
5.  Somewhat thick-walled urinary bladder.  Cannot exclude
cystitis.

## 2016-12-03 HISTORY — PX: OTHER SURGICAL HISTORY: SHX169

## 2016-12-13 ENCOUNTER — Encounter: Payer: Self-pay | Admitting: Adult Health

## 2016-12-13 ENCOUNTER — Non-Acute Institutional Stay (SKILLED_NURSING_FACILITY): Payer: Medicaid Other | Admitting: Adult Health

## 2016-12-13 DIAGNOSIS — K5909 Other constipation: Secondary | ICD-10-CM | POA: Diagnosis not present

## 2016-12-13 DIAGNOSIS — I10 Essential (primary) hypertension: Secondary | ICD-10-CM

## 2016-12-13 DIAGNOSIS — Z794 Long term (current) use of insulin: Secondary | ICD-10-CM | POA: Diagnosis not present

## 2016-12-13 DIAGNOSIS — F01518 Vascular dementia, unspecified severity, with other behavioral disturbance: Secondary | ICD-10-CM | POA: Insufficient documentation

## 2016-12-13 DIAGNOSIS — E1159 Type 2 diabetes mellitus with other circulatory complications: Secondary | ICD-10-CM

## 2016-12-13 DIAGNOSIS — IMO0002 Reserved for concepts with insufficient information to code with codable children: Secondary | ICD-10-CM | POA: Insufficient documentation

## 2016-12-13 DIAGNOSIS — K219 Gastro-esophageal reflux disease without esophagitis: Secondary | ICD-10-CM | POA: Insufficient documentation

## 2016-12-13 DIAGNOSIS — J449 Chronic obstructive pulmonary disease, unspecified: Secondary | ICD-10-CM

## 2016-12-13 DIAGNOSIS — E118 Type 2 diabetes mellitus with unspecified complications: Secondary | ICD-10-CM | POA: Diagnosis not present

## 2016-12-13 DIAGNOSIS — F0151 Vascular dementia with behavioral disturbance: Secondary | ICD-10-CM | POA: Diagnosis not present

## 2016-12-13 DIAGNOSIS — E1165 Type 2 diabetes mellitus with hyperglycemia: Secondary | ICD-10-CM

## 2016-12-13 DIAGNOSIS — S92302S Fracture of unspecified metatarsal bone(s), left foot, sequela: Secondary | ICD-10-CM | POA: Diagnosis not present

## 2016-12-13 DIAGNOSIS — I152 Hypertension secondary to endocrine disorders: Secondary | ICD-10-CM

## 2016-12-13 DIAGNOSIS — E1143 Type 2 diabetes mellitus with diabetic autonomic (poly)neuropathy: Secondary | ICD-10-CM | POA: Insufficient documentation

## 2016-12-13 DIAGNOSIS — F250 Schizoaffective disorder, bipolar type: Secondary | ICD-10-CM

## 2016-12-13 NOTE — Progress Notes (Signed)
Location:   [facility] Room Number: 128 A Place of Service:  SNF (31)   CODE STATUS: Full Code  Allergies  Allergen Reactions  . Metformin And Related Other (See Comments)    GI Distress    Chief Complaint  Patient presents with  . Hospitalization Follow-up    hospital follow up    HPI:  She is a 61 year old resident of a group who fell and twisted her left ankle. She was found to have a left foot fracture open fracture undergone ORIF with I/D of the wound. She is here for short term rehab with her goal to return back home. She is telling me that se is not going to return back to the group home and is going to live with her daughter. There are no nursing concerns at this time.    Past Medical History:  Diagnosis Date  . Bipolar 1 disorder (HCC)   . COPD (chronic obstructive pulmonary disease) (HCC)   . Diabetes mellitus   . Fall at home   . Hypertension   . Intellectual disability   . Pancreatitis   . Schizoaffective disorder, bipolar type [facility])     Past Surgical History:  Procedure Laterality Date  . arm surgery    . i/d left foot Left 12/03/2016  . orif foot Left 12/03/2016    Social History   Social History  . Marital status: Married    Spouse name: N/A  . Number of children: N/A  . Years of education: N/A   Occupational History  . Not on file.   Social History Main Topics  . Smoking status: Current Every Day Smoker    Packs/day: 1.00    Years: 5.00    Types: Cigarettes  . Smokeless tobacco: Never Used  . Alcohol use 0.0 oz/week    2 - 5 Glasses of wine per week     Comment: unknown/pt confused  . Drug use: Yes    Types: Marijuana  . Sexual activity: No   Other Topics Concern  . Not on file   Social History Narrative  . No narrative on file   History reviewed. No pertinent family history.    VITAL SIGNS BP (!) 154/90   Pulse 94   Temp 97.7 F (36.5 C)   Resp 20   Ht 5\' 3"  (1.6 m)   LMP 04/19/2011   SpO2 94%    Patient's Medications  New Prescriptions   No medications on file  Previous Medications   novasc 10 mg  Take 10 mg daily    ACETAMINOPHEN (TYLENOL) 325 MG TABLET    Take 650 mg by mouth every 4 (four) hours as needed for mild pain. NOT TO EXCEED 3000MG  DAILY   ALBUTEROL (PROVENTIL HFA;VENTOLIN HFA) 108 (90 BASE) MCG/ACT INHALER    Inhale 2 puffs into the lungs every 6 (six) hours.   ALUMINUM-MAGNESIUM HYDROXIDE-SIMETHICONE (MAALOX) 200-200-20 MG/5ML SUSP    Take 30 mLs by mouth every 6 (six) hours as needed.   AMMONIUM LACTATE (LAC-HYDRIN) 12 % LOTION    Apply 1 application topically. Apply to affected area topically two times daily   ASENAPINE MALEATE 10 MG SUBL    Place 1 tablet under the tongue 2 (two) times daily.   ASPIRIN EC 81 MG TABLET    Take 81 mg by mouth daily.   BISACODYL (BISACODYL) 5 MG EC TABLET    Take 5 mg by mouth daily as needed for moderate constipation.   BUDESONIDE-FORMOTEROL (  SYMBICORT) 160-4.5 MCG/ACT INHALER    Inhale 1 puff into the lungs 2 (two) times daily.   CARIPRAZINE HCL 3 MG CAPS    Take 1 capsule by mouth daily.   DEXTROSE (GLUTOSE) 40 % GEL    Give 15 gram by mouth as needed for hypoglycemia less than 60mg /dl   DIPHENHYDRAMINE (BENADRYL) 25 MG TABLET    Give 2 tablets by mouth daily at bedtime as needed for insomnia   DOCUSATE SODIUM (COLACE) 100 MG CAPSULE    Take 100 mg by mouth 2 (two) times daily.   DONEPEZIL (ARICEPT) 5 MG TABLET    Take 5 mg by mouth at bedtime.   ENOXAPARIN (LOVENOX) 40 MG/0.4ML INJECTION    Inject 40 mg into the skin every 12 (twelve) hours.   EXENATIDE ER 2 MG PEN    Inject 2 mg subcutaneously one time a day every Wednesday   GABAPENTIN (NEURONTIN) 300 MG CAPSULE    Take 300 mg by mouth 3 (three) times daily.   GLIMEPIRIDE (AMARYL) 4 MG TABLET    Take 4 mg by mouth 2 (two) times daily.   GUAIFENESIN (ROBAFEN) 100 MG/5ML SYRUP    Give 5 ml by mouth every 6 hours as needed for cough   HALOPERIDOL (HALDOL) 0.5 MG TABLET    Take 0.5  mg by mouth 3 (three) times daily.   IBUPROFEN (ADVIL,MOTRIN) 600 MG TABLET    Take 600 mg by mouth every 8 (eight) hours as needed for mild pain.   INSULIN GLARGINE (LANTUS) 100 UNIT/ML SOPN    Inject 45 Units into the skin daily.   INSULIN LISPRO (HUMALOG KWIKPEN) 100 UNIT/ML KIWKPEN    Inject 10 Units into the skin. Three times daily with meals   IPRATROPIUM-ALBUTEROL (COMBIVENT) 20-100 MCG/ACT AERS RESPIMAT    Inhale 1 puff into the lungs every 6 (six) hours.   IPRATROPIUM-ALBUTEROL (DUONEB) 0.5-2.5 (3) MG/3ML SOLN    Take 3 mLs by nebulization every 4 (four) hours as needed.   LISINOPRIL (PRINIVIL,ZESTRIL) 5 MG TABLET    Take 5 mg by mouth daily.   LOPERAMIDE (IMODIUM A-D) 2 MG TABLET    Take 2 mg by mouth daily as needed for diarrhea or loose stools.   MAGNESIUM HYDROXIDE (MILK OF MAGNESIA) 400 MG/5ML SUSPENSION    Take 30 mLs by mouth daily as needed for mild constipation.   MELATONIN 3 MG TABS    Take 1 tablet by mouth at bedtime.   OMEPRAZOLE (PRILOSEC) 20 MG CAPSULE    Take 20 mg by mouth 2 (two) times daily.   POLYETHYLENE GLYCOL (MIRALAX / GLYCOLAX) PACKET    Take 17 g by mouth daily.   PROPRANOLOL (INDERAL) 20 MG TABLET    Take 20 mg by mouth 2 (two) times daily.   QUETIAPINE (SEROQUEL XR) 400 MG 24 HR TABLET    Take 400 mg by mouth at bedtime.   SERTRALINE (ZOLOFT) 100 MG TABLET    Take 100 mg by mouth daily.   TOPIRAMATE (TOPAMAX) 50 MG TABLET    Take 50 mg by mouth 2 (two) times daily.   TRAMADOL (ULTRAM) 50 MG TABLET    Take 50 mg by mouth every 6 (six) hours as needed for severe pain.  Modified Medications   No medications on file  Discontinued Medications   DONEPEZIL (ARICEPT) 5 MG TABLET    Take 1 tablet (5 mg total) by mouth at bedtime. For clear thoughts and calm mood.   GABAPENTIN (NEURONTIN) 300 MG  CAPSULE    Take 1 capsule (300 mg total) by mouth 3 (three) times daily at 8am, 2pm and bedtime. For anxiety.   LISINOPRIL (PRINIVIL,ZESTRIL) 20 MG TABLET    Take 1 tablet (20  mg total) by mouth daily. For blood pressure.   TRAZODONE (DESYREL) 150 MG TABLET    Take 1 tablet (150 mg total) by mouth at bedtime. Helps with sleep.     SIGNIFICANT DIAGNOSTIC EXAMS   NO LABS OR EXAM RESULTS AVAILABLE.    Review of Systems  Constitutional: Negative for malaise/fatigue.  Respiratory: Negative for cough and shortness of breath.   Cardiovascular: Negative for chest pain, palpitations and leg swelling.  Gastrointestinal: Negative for abdominal pain, constipation and heartburn.  Musculoskeletal: Negative for back pain, joint pain and myalgias.       Her pain in her left foot is managed with tylenol   Skin: Negative.   Neurological: Negative for dizziness.  Psychiatric/Behavioral: The patient is not nervous/anxious.     Physical Exam  Constitutional: She is oriented to person, place, and time. She appears well-developed and well-nourished. No distress.  Obese   Eyes: Conjunctivae are normal.  Neck: Neck supple. No JVD present. No thyromegaly present.  Cardiovascular: Normal rate, regular rhythm and intact distal pulses.   Respiratory: Effort normal and breath sounds normal. No respiratory distress. She has no wheezes.  GI: Soft. Bowel sounds are normal. She exhibits no distension. There is no tenderness.  Musculoskeletal: She exhibits no edema.  Able to move all extremities  Has ace wrap cast on her left lower extremity   Lymphadenopathy:    She has no cervical adenopathy.  Neurological: She is alert and oriented to person, place, and time.  Skin: Skin is warm and dry. She is not diaphoretic.  Psychiatric:  Is easily agitated       ASSESSMENT/ PLAN:  TODAY:   1. COPD: is stable will continue albuterol 2 puffs every 6 hours; symbicort 150/4.5 mcg 2 puffs twice daily; combivent 1 puff every 6 hours and duoneb every 4 hours as needed  2. GERD: stable will continue prilosec 20 mg twice daily   3. Diabetes: is stable will continue lantus 45 units nightly;  humalog 10 units with meals; exenatide  2 mg every Wednesday; amaryl 4 mg twice daily  Will monitor   4. Peripheral neuropathy: stable will continue neurontin 300 mg three times daily   5. Constipation: stable  will continue colace twice daily miralax daily   6.  Hypertension: is worse b/p 154/90: will continue asa 81 mg daily  inderal 20 mg twice daily  norvasc 10 mg daily will increase to  lisinopril 10 mg daily   7.  Schizoaffective disorder bipolar type: has depression: is presently stable on a complex regimen: saphris 10 mg twice daily; vraylar 3 mg daily haldol 0.5 mg three times daily; topamax 50 mg twice daily to help stabilize mood and zoloft 100 mg daily and takes melatonin 3 mg nightly for sleep  8. Vascular dementia: is without change will continue aricept 5 mg nightly   9. Left foot fracture, open: status post ORIF and I/D of the open fracture: is stable; is no weight bearing.  will continue to follow up orthopedics as indicated; will continue therapy as directed; has tylenol 650 mg every 4 hours as needed; ultram 50 mg every 6 hours as needed and motrin 800 mg every 8 hours as needed. Will continue lovenox 40 mg daily for   Will check cbc;  cmp; hgb a1c   Time spent with patient: 50 minutes to include patient education; medical record review and discussion with staff members   MD is aware of resident's narcotic use and is in agreement with current plan of care. We will attempt to wean resident as apropriate   Synthia Innocenteborah Green NP [facility]  Contact [phone] Monday through Friday 8am- 5pm  After hours call [phone]

## 2016-12-14 LAB — CBC AND DIFFERENTIAL
HCT: 38 (ref 36–46)
Hemoglobin: 12.4 (ref 12.0–16.0)
Neutrophils Absolute: 4
PLATELETS: 254 (ref 150–399)
WBC: 6.4

## 2016-12-14 LAB — BASIC METABOLIC PANEL
BUN: 19 (ref 4–21)
CREATININE: 1 (ref 0.5–1.1)
GLUCOSE: 201
Potassium: 4.3 (ref 3.4–5.3)
Sodium: 142 (ref 137–147)

## 2016-12-14 LAB — HEPATIC FUNCTION PANEL
ALT: 299 — AB (ref 7–35)
AST: 221 — AB (ref 13–35)
Alkaline Phosphatase: 187 — AB (ref 25–125)
BILIRUBIN, TOTAL: 3.5

## 2016-12-16 ENCOUNTER — Non-Acute Institutional Stay (SKILLED_NURSING_FACILITY): Payer: Medicaid Other | Admitting: Internal Medicine

## 2016-12-16 ENCOUNTER — Encounter: Payer: Self-pay | Admitting: Internal Medicine

## 2016-12-16 DIAGNOSIS — Z794 Long term (current) use of insulin: Secondary | ICD-10-CM | POA: Diagnosis not present

## 2016-12-16 DIAGNOSIS — Z8781 Personal history of (healed) traumatic fracture: Secondary | ICD-10-CM

## 2016-12-16 DIAGNOSIS — R945 Abnormal results of liver function studies: Secondary | ICD-10-CM

## 2016-12-16 DIAGNOSIS — F250 Schizoaffective disorder, bipolar type: Secondary | ICD-10-CM

## 2016-12-16 DIAGNOSIS — I152 Hypertension secondary to endocrine disorders: Secondary | ICD-10-CM

## 2016-12-16 DIAGNOSIS — F0151 Vascular dementia with behavioral disturbance: Secondary | ICD-10-CM | POA: Diagnosis not present

## 2016-12-16 DIAGNOSIS — F01518 Vascular dementia, unspecified severity, with other behavioral disturbance: Secondary | ICD-10-CM

## 2016-12-16 DIAGNOSIS — E1140 Type 2 diabetes mellitus with diabetic neuropathy, unspecified: Secondary | ICD-10-CM | POA: Diagnosis not present

## 2016-12-16 DIAGNOSIS — R2231 Localized swelling, mass and lump, right upper limb: Secondary | ICD-10-CM | POA: Diagnosis not present

## 2016-12-16 DIAGNOSIS — I10 Essential (primary) hypertension: Secondary | ICD-10-CM

## 2016-12-16 DIAGNOSIS — R7989 Other specified abnormal findings of blood chemistry: Secondary | ICD-10-CM

## 2016-12-16 DIAGNOSIS — E1159 Type 2 diabetes mellitus with other circulatory complications: Secondary | ICD-10-CM

## 2016-12-16 DIAGNOSIS — Z9889 Other specified postprocedural states: Secondary | ICD-10-CM

## 2016-12-16 DIAGNOSIS — Z967 Presence of other bone and tendon implants: Secondary | ICD-10-CM | POA: Diagnosis not present

## 2016-12-16 NOTE — Progress Notes (Signed)
Patient ID: [name], female   DOB: Sep 07, 1955, 61 y.o.   MRN: [id number]     HISTORY AND PHYSICAL   DATE:  December 16, 2016  Location:   [facility] Room Number: 128 A Place of Service: SNF (31)   Extended Emergency Contact Information Primary Emergency Contact: Sims,Melinda Address: [address]          [place], [place] [address] [place] Phone: [phone] Relation: Daughter  Advanced Directive information Does Patient Have a Medical Advance Directive?: No, Would patient like information on creating a medical advance directive?: No - Patient declined  Chief Complaint  Patient presents with  . New Admit To SNF    Admission    HPI:  61 yo female seen today as a new admission into SNF following hospital stay for left foot fx. She underwent ORIF on 12/03/16.  Cr 1.29; glucose 200-300s; Hgb 11.9 at d/c. She presents to SNF for short term rehab.  Today she c/o right arm pain/swelling at site of pneumovax injection upon admission into SNF. No f/c. She has no other concerns. She is a poor historian due to psych d/o. Hx obtained from chart. SNF labs reviewed. CBGs 110-250s; ALT 303; AST 221; alk phos 187; Hgb 12.4. Pain controlled on tylenol 650 mg every 4 hours as needed; ultram 50 mg every 6 hours as needed and motrin 800 mg every 8 hours as needed. She gets lovenox daily for DVT prophylaxis.  COPD - stable. No exacerbations on albuterol 2 puffs every 6 hours; symbicort 150/4.5 mcg 2 puffs twice daily; combivent 1 puff every 6 hours and duoneb every 4 hours as needed  GERD - stable on prilosec 20 mg twice daily   DM - BS fluctuating with max 250s. She gets lantus 45 units nightly; humalog 10 units with meals; exenatide  2 mg every Wednesday; amaryl 4 mg twice daily. No low BS reactions. She has neuropathy and takes neurontin 300 mg three times daily   Constipation - stable on colace twice daily; miralax daily   Hypertension - stable on  ASA 81 mg daily;  inderal 20 mg twice daily;  norvasc 10 mg daily;  lisinopril 10 mg daily   Schizoaffective disorder bipolar type (depression) - mood stable on a complex regimen of saphris 10 mg twice daily; vraylar 3 mg daily; haldol 0.5 mg three times daily; topamax 50 mg twice daily; zoloft 100 mg daily; seroquel XR '400mg'$  qHS. She also takes melatonin 3 mg nightly for sleep  Vascular dementia - stable on aricept 5 mg nightly   Etoh abuse - ongoing. ALT 303; AST 221; alk phos 187  Past Medical History:  Diagnosis Date  . Bipolar 1 disorder ([facility])   . COPD (chronic obstructive pulmonary disease) ([facility])   . Diabetes mellitus   . Fall at home   . Hypertension   . Intellectual disability   . Pancreatitis   . Schizoaffective disorder, bipolar type [facility])     Past Surgical History:  Procedure Laterality Date  . arm surgery    . i/d left foot Left 12/03/2016  . orif foot Left 12/03/2016    No care team member to display  Social History   Social History  . Marital status: Married    Spouse name: N/A  . Number of children: N/A  . Years of education: N/A   Occupational History  . Not on file.   Social History Main Topics  . Smoking status: Current Every  Day Smoker    Packs/day: 1.00    Years: 5.00    Types: Cigarettes  . Smokeless tobacco: Never Used  . Alcohol use 0.0 oz/week    2 - 5 Glasses of wine per week     Comment: unknown/pt confused  . Drug use: Yes    Types: Marijuana  . Sexual activity: No   Other Topics Concern  . Not on file   Social History Narrative  . No narrative on file     reports that she has been smoking Cigarettes.  She has a 5.00 pack-year smoking history. She has never used smokeless tobacco. She reports that she drinks alcohol. She reports that she uses drugs, including Marijuana.  History reviewed. No pertinent family history. Family Status  Relation Status  . Mother Deceased  . Father Deceased    Immunization History    Administered Date(s) Administered  . PPD Test 05/16/2011, 05/22/2011  . Tdap 05/16/2011    Allergies  Allergen Reactions  . Metformin And Related Other (See Comments)    GI Distress    Medications: Patient's Medications  New Prescriptions   No medications on file  Previous Medications   ACETAMINOPHEN (TYLENOL) 325 MG TABLET    Take 650 mg by mouth every 4 (four) hours as needed for mild pain. NOT TO EXCEED '3000MG'$  DAILY   ALBUTEROL (PROVENTIL HFA;VENTOLIN HFA) 108 (90 BASE) MCG/ACT INHALER    Inhale 2 puffs into the lungs every 6 (six) hours.   ALUMINUM-MAGNESIUM HYDROXIDE-SIMETHICONE (MAALOX) 742-595-63 MG/5ML SUSP    Take 30 mLs by mouth every 6 (six) hours as needed.   AMLODIPINE (NORVASC) 10 MG TABLET    Take 10 mg by mouth daily.   AMMONIUM LACTATE (LAC-HYDRIN) 12 % LOTION    Apply 1 application topically. Apply to affected area topically two times daily   ASENAPINE MALEATE 10 MG SUBL    Place 1 tablet under the tongue 2 (two) times daily.   ASPIRIN EC 81 MG TABLET    Take 81 mg by mouth daily.   BISACODYL (BISACODYL) 5 MG EC TABLET    Take 5 mg by mouth daily as needed for moderate constipation.   BUDESONIDE-FORMOTEROL (SYMBICORT) 160-4.5 MCG/ACT INHALER    Inhale 1 puff into the lungs 2 (two) times daily.   CARIPRAZINE HCL 3 MG CAPS    Take 1 capsule by mouth daily.   DEXTROSE (GLUTOSE) 40 % GEL    Give 15 gram by mouth as needed for hypoglycemia less than '60mg'$ /dl   DIPHENHYDRAMINE (BENADRYL) 25 MG TABLET    Give 2 tablets by mouth daily at bedtime as needed for insomnia   DOCUSATE SODIUM (COLACE) 100 MG CAPSULE    Take 100 mg by mouth 2 (two) times daily.   DONEPEZIL (ARICEPT) 5 MG TABLET    Take 5 mg by mouth at bedtime.   ENOXAPARIN (LOVENOX) 40 MG/0.4ML INJECTION    Inject 40 mg into the skin every 12 (twelve) hours.   EXENATIDE ER 2 MG PEN    Inject 2 mg subcutaneously one time a day every Wednesday   GABAPENTIN (NEURONTIN) 300 MG CAPSULE    Take 300 mg by mouth 3 (three)  times daily.   GLIMEPIRIDE (AMARYL) 4 MG TABLET    Take 4 mg by mouth 2 (two) times daily.   GUAIFENESIN (ROBAFEN) 100 MG/5ML SYRUP    Give 5 ml by mouth every 6 hours as needed for cough   HALOPERIDOL (HALDOL) 0.5 MG TABLET  Take 0.5 mg by mouth 3 (three) times daily.   IBUPROFEN (ADVIL,MOTRIN) 600 MG TABLET    Take 600 mg by mouth every 8 (eight) hours as needed for mild pain.   INSULIN GLARGINE (LANTUS) 100 UNIT/ML SOPN    Inject 45 Units into the skin daily.   INSULIN LISPRO (HUMALOG KWIKPEN) 100 UNIT/ML KIWKPEN    Inject 10 Units into the skin. Three times daily with meals   IPRATROPIUM-ALBUTEROL (COMBIVENT) 20-100 MCG/ACT AERS RESPIMAT    Inhale 1 puff into the lungs every 6 (six) hours.   IPRATROPIUM-ALBUTEROL (DUONEB) 0.5-2.5 (3) MG/3ML SOLN    Take 3 mLs by nebulization every 4 (four) hours as needed.   LISINOPRIL (PRINIVIL,ZESTRIL) 10 MG TABLET    Take 10 mg by mouth daily.    LOPERAMIDE (IMODIUM A-D) 2 MG TABLET    Take 2 mg by mouth daily as needed for diarrhea or loose stools.   MAGNESIUM HYDROXIDE (MILK OF MAGNESIA) 400 MG/5ML SUSPENSION    Take 30 mLs by mouth daily as needed for mild constipation.   MELATONIN 3 MG TABS    Take 1 tablet by mouth at bedtime.   OMEPRAZOLE (PRILOSEC) 20 MG CAPSULE    Take 20 mg by mouth 2 (two) times daily.   POLYETHYLENE GLYCOL (MIRALAX / GLYCOLAX) PACKET    Take 17 g by mouth daily.   PROPRANOLOL (INDERAL) 20 MG TABLET    Take 20 mg by mouth 2 (two) times daily.   QUETIAPINE (SEROQUEL XR) 400 MG 24 HR TABLET    Take 400 mg by mouth at bedtime.   SERTRALINE (ZOLOFT) 100 MG TABLET    Take 100 mg by mouth daily.   TOPIRAMATE (TOPAMAX) 50 MG TABLET    Take 50 mg by mouth 2 (two) times daily.   TRAMADOL (ULTRAM) 50 MG TABLET    Take 50 mg by mouth every 6 (six) hours as needed for severe pain.  Modified Medications   No medications on file  Discontinued Medications   No medications on file    Review of Systems  Unable to perform ROS: Psychiatric  disorder (delusions)    Vitals:   12/16/16 0952  BP: (!) 154/90  Pulse: 80  Resp: 20  Temp: 98.8 F (37.1 C)  SpO2: 92%  Weight: 227 lb 9.6 oz (103.2 kg)  Height: '5\' 3"'$  (1.6 m)   Body mass index is 40.32 kg/m.  Physical Exam  Constitutional: She is oriented to person, place, and time. She appears well-developed.  Frail appearing sitting up in bed in NAD  HENT:  Mouth/Throat: Oropharynx is clear and moist. No oropharyngeal exudate.  MMM; no oral thrush  Eyes: Pupils are equal, round, and reactive to light. No scleral icterus.  Neck: Neck supple. Carotid bruit is not present. No tracheal deviation present. No thyromegaly present.  obese  Cardiovascular: Normal rate, regular rhythm and intact distal pulses.  Exam reveals no gallop and no friction rub.   Murmur (1/6 SEM) heard. No LE edema b/l. no calf TTP.   Pulmonary/Chest: Effort normal. No stridor. No respiratory distress. She has wheezes (end expiratory b/l). She has no rales.  Abdominal: Soft. Normal appearance and bowel sounds are normal. She exhibits no distension and no mass. There is no hepatomegaly. There is no tenderness. There is no rigidity, no rebound and no guarding. No hernia.  Musculoskeletal: She exhibits edema and tenderness.  LLE splint and dsg c/d/i; FROM toes b/l with excellent capillary refill  Lymphadenopathy:  She has no cervical adenopathy.  Neurological: She is alert and oriented to person, place, and time.  Skin: Skin is warm and dry. No rash noted. There is erythema (right lateral arm with large area increased swelling, warmth, blancheable, TTP, redness but no d/c).  Psychiatric: She has a normal mood and affect. Her behavior is normal. Thought content is delusional.     Labs reviewed: Abstract on 12/16/2016  Component Date Value Ref Range Status  . Hemoglobin 12/14/2016 12.4  12.0 - 16.0 Final  . HCT 12/14/2016 38  36 - 46 Final  . Neutrophils Absolute 12/14/2016 4   Final  . Platelets  12/14/2016 254  150 - 399 Final  . WBC 12/14/2016 6.4   Final  . Glucose 12/14/2016 201   Final  . BUN 12/14/2016 19  4 - 21 Final  . Creatinine 12/14/2016 1.0  0.5 - 1.1 Final  . Potassium 12/14/2016 4.3  3.4 - 5.3 Final  . Sodium 12/14/2016 142  137 - 147 Final  . Alkaline Phosphatase 12/14/2016 187* 25 - 125 Final  . ALT 12/14/2016 299* 7 - 35 Final   actual reading = 303  . AST 12/14/2016 221* 13 - 35 Final  . Bilirubin, Total 12/14/2016 3.5   Final    No results found.   Assessment/Plan   ICD-10-CM   1. Hypertension complicating diabetes ([place]) E11.59    I10    uncontrolled - med recently adjusted  2. Type 2 diabetes mellitus with diabetic neuropathy, with long-term current use of insulin ([facility]) E11.40    Z79.4   3. Localized swelling, mass, or lump of right upper extremity R22.31   4. Abnormal LFTs R94.5    with hx Etoh abuse  5. S/P ORIF (open reduction internal fixation) fracture Z96.7    Z87.81    left foot on 12/03/16  6. Schizoaffective disorder, bipolar type ([place]) F25.0   7. Vascular dementia with behavior disturbance F01.51    D/c Tylenol 2/2 elevated LFTs  Cont other meds as ordered  A1c results not available  Wound care to follow right arm redness. No obvious infection at this time  PT/OT/St as ordered  Wound care as ordered  Psych services to follow  F/u with Ortho as scheduled  T/c RUQ [place] to further investigates increased LFTs  GOAL: short term rehab and d/c home when medically appropriate. Communicated with pt and nursing.  Will follow   Rickardo Brinegar S. Perlie Gold  [facility] [address] [place], [place] [address] [phone] Cell (Monday-Friday 8 AM - 5 PM) [phone] After 5 PM and follow prompts

## 2016-12-17 ENCOUNTER — Encounter: Payer: Self-pay | Admitting: Adult Health

## 2016-12-17 ENCOUNTER — Non-Acute Institutional Stay (SKILLED_NURSING_FACILITY): Payer: Medicaid Other | Admitting: Adult Health

## 2016-12-17 DIAGNOSIS — F250 Schizoaffective disorder, bipolar type: Secondary | ICD-10-CM

## 2016-12-17 DIAGNOSIS — S92302S Fracture of unspecified metatarsal bone(s), left foot, sequela: Secondary | ICD-10-CM | POA: Diagnosis not present

## 2016-12-17 NOTE — Progress Notes (Signed)
Location:   [facility] Room Number: 128 A Place of Service:  SNF (31)   CODE STATUS: Full Code  Allergies  Allergen Reactions  . Metformin And Related Other (See Comments)    GI Distress    Chief Complaint  Patient presents with  . Acute Visit    72 hour care plan meeting    HPI:  The care plan team; myself patient and family have come together to discuss her current condition; goals of care and present goal of placement upon discharge. She had been living in a group home and will not return back; will go home with her daughter. She has not had a psych care in the past 10 years. She will be followed by psych services while she is here. She is on numerous anti-psychotic medications and will need to have them weaned. She will need home health upon discharge. Her therapy goals have been discussed along with her nursing care needs at this time.    Past Medical History:  Diagnosis Date  . Bipolar 1 disorder ([facility])   . COPD (chronic obstructive pulmonary disease) ([facility])   . Diabetes mellitus   . Fall at home   . Hypertension   . Intellectual disability   . Pancreatitis   . Schizoaffective disorder, bipolar type [facility])     Past Surgical History:  Procedure Laterality Date  . arm surgery    . i/d left foot Left 12/03/2016  . orif foot Left 12/03/2016    Social History   Social History  . Marital status: Married    Spouse name: N/A  . Number of children: N/A  . Years of education: N/A   Occupational History  . Not on file.   Social History Main Topics  . Smoking status: Current Every Day Smoker    Packs/day: 1.00    Years: 5.00    Types: Cigarettes  . Smokeless tobacco: Never Used  . Alcohol use 0.0 oz/week    2 - 5 Glasses of wine per week     Comment: unknown/pt confused  . Drug use: Yes    Types: Marijuana  . Sexual activity: No   Other Topics Concern  . Not on file   Social History Narrative  . No narrative on file   History reviewed. No  pertinent family history.    VITAL SIGNS BP 114/60   Pulse 84   Temp 98 F (36.7 C)   Resp 20   Ht 5\' 3"  (1.6 m)   Wt 227 lb 9.6 oz (103.2 kg)   LMP 04/19/2011   SpO2 92%   BMI 40.32 kg/m   Patient's Medications  New Prescriptions   No medications on file  Previous Medications   ACETAMINOPHEN (TYLENOL) 325 MG TABLET    Take 650 mg by mouth every 4 (four) hours as needed for mild pain. NOT TO EXCEED 3000MG  DAILY   ALBUTEROL (PROVENTIL HFA;VENTOLIN HFA) 108 (90 BASE) MCG/ACT INHALER    Inhale 2 puffs into the lungs every 6 (six) hours.   ALUMINUM-MAGNESIUM HYDROXIDE-SIMETHICONE (MAALOX) 200-200-20 MG/5ML SUSP    Take 30 mLs by mouth every 6 (six) hours as needed.   AMLODIPINE (NORVASC) 10 MG TABLET    Take 10 mg by mouth daily.   AMMONIUM LACTATE (LAC-HYDRIN) 12 % LOTION    Apply 1 application topically. Apply to affected area topically two times daily   ASENAPINE MALEATE 10 MG SUBL    Place 1 tablet under the tongue 2 (two) times  daily.   ASPIRIN EC 81 MG TABLET    Take 81 mg by mouth daily.   BISACODYL (BISACODYL) 5 MG EC TABLET    Take 5 mg by mouth daily as needed for moderate constipation.   BUDESONIDE-FORMOTEROL (SYMBICORT) 160-4.5 MCG/ACT INHALER    Inhale 1 puff into the lungs 2 (two) times daily.   CARIPRAZINE HCL 3 MG CAPS    Take 1 capsule by mouth daily.   DEXTROSE (GLUTOSE) 40 % GEL    Give 15 gram by mouth as needed for hypoglycemia less than 60mg /dl   DIPHENHYDRAMINE (BENADRYL) 25 MG TABLET    Give 2 tablets by mouth daily at bedtime as needed for insomnia   DOCUSATE SODIUM (COLACE) 100 MG CAPSULE    Take 100 mg by mouth 2 (two) times daily.   DONEPEZIL (ARICEPT) 5 MG TABLET    Take 5 mg by mouth at bedtime.   ENOXAPARIN (LOVENOX) 40 MG/0.4ML INJECTION    Inject 40 mg into the skin every 12 (twelve) hours.   EXENATIDE ER 2 MG PEN    Inject 2 mg subcutaneously one time a day every Wednesday   GABAPENTIN (NEURONTIN) 300 MG CAPSULE    Take 300 mg by mouth 3 (three)  times daily.   GLIMEPIRIDE (AMARYL) 4 MG TABLET    Take 4 mg by mouth 2 (two) times daily.   GUAIFENESIN (ROBAFEN) 100 MG/5ML SYRUP    Give 5 ml by mouth every 6 hours as needed for cough   HALOPERIDOL (HALDOL) 0.5 MG TABLET    Take 0.5 mg by mouth 3 (three) times daily.   IBUPROFEN (ADVIL,MOTRIN) 600 MG TABLET    Take 600 mg by mouth every 8 (eight) hours as needed for mild pain.   INSULIN GLARGINE (LANTUS) 100 UNIT/ML SOPN    Inject 45 Units into the skin daily.   INSULIN LISPRO (HUMALOG KWIKPEN) 100 UNIT/ML KIWKPEN    Inject 10 Units into the skin. Three times daily with meals   IPRATROPIUM-ALBUTEROL (COMBIVENT) 20-100 MCG/ACT AERS RESPIMAT    Inhale 1 puff into the lungs every 6 (six) hours.   IPRATROPIUM-ALBUTEROL (DUONEB) 0.5-2.5 (3) MG/3ML SOLN    Take 3 mLs by nebulization every 4 (four) hours as needed.   LISINOPRIL (PRINIVIL,ZESTRIL) 10 MG TABLET    Take 10 mg by mouth daily.    LOPERAMIDE (IMODIUM A-D) 2 MG TABLET    Take 2 mg by mouth daily as needed for diarrhea or loose stools.   MAGNESIUM HYDROXIDE (MILK OF MAGNESIA) 400 MG/5ML SUSPENSION    Take 30 mLs by mouth daily as needed for mild constipation.   MELATONIN 3 MG TABS    Take 1 tablet by mouth at bedtime.   OMEPRAZOLE (PRILOSEC) 20 MG CAPSULE    Take 20 mg by mouth 2 (two) times daily.   POLYETHYLENE GLYCOL (MIRALAX / GLYCOLAX) PACKET    Take 17 g by mouth daily.   PROPRANOLOL (INDERAL) 20 MG TABLET    Take 20 mg by mouth 2 (two) times daily.   QUETIAPINE (SEROQUEL XR) 400 MG 24 HR TABLET    Take 400 mg by mouth at bedtime.   SERTRALINE (ZOLOFT) 100 MG TABLET    Take 100 mg by mouth daily.   TOPIRAMATE (TOPAMAX) 50 MG TABLET    Take 50 mg by mouth 2 (two) times daily.   TRAMADOL (ULTRAM) 50 MG TABLET    Take 50 mg by mouth every 6 (six) hours as needed for severe pain.  Modified Medications   No medications on file  Discontinued Medications   No medications on file     SIGNIFICANT DIAGNOSTIC EXAMS  NO LABS OR EXAM  RESULTS AVAILABLE.   Review of Systems  Constitutional: Negative for malaise/fatigue.  Respiratory: Negative for cough and shortness of breath.   Cardiovascular: Negative for chest pain, palpitations and leg swelling.  Gastrointestinal: Negative for abdominal pain, constipation and heartburn.  Musculoskeletal: Negative for back pain, joint pain and myalgias.       Her pain in her left foot is managed with tylenol   Skin: Negative.   Neurological: Negative for dizziness.  Psychiatric/Behavioral: The patient is not nervous/anxious.    Physical Exam  Constitutional: She is oriented to person, place, and time. She appears well-developed and well-nourished. No distress.  Obese   Eyes: Conjunctivae are normal.  Neck: Neck supple. No JVD present. No thyromegaly present.  Cardiovascular: Normal rate, regular rhythm and intact distal pulses.   Respiratory: Effort normal and breath sounds normal. No respiratory distress. She has no wheezes.  GI: Soft. Bowel sounds are normal. She exhibits no distension. There is no tenderness.  Musculoskeletal: She exhibits no edema.  Able to move all extremities  Has ace wrap cast on her left lower extremity   Lymphadenopathy:    She has no cervical adenopathy.  Neurological: She is alert and oriented to person, place, and time.  Skin: Skin is warm and dry. She is not diaphoretic.  Psychiatric:  Is calm  ASSESSMENT/ PLAN:  TODAY:   1.  Schizoaffective disorder bipolar type: has depression: is presently stable on a complex regimen 2.  Left foot fracture, open: status post ORIF and I/D of the open fracture:   Will continue her current plan of care at this time and will continue to monitor her status.    Time spent with patient family and care plan team: 30 minutes discussed with understanding: her goals of care; therapy at this facility; discharge goal and plan. Family has verbalized understanding.    MD is aware of resident's narcotic use and is in  agreement with current plan of care. We will attempt to wean resident as apropriate     Synthia Innocent NP [facility]  Contact [phone] Monday through Friday 8am- 5pm  After hours call [phone]

## 2017-01-02 ENCOUNTER — Other Ambulatory Visit: Payer: Self-pay | Admitting: Internal Medicine

## 2017-01-23 ENCOUNTER — Non-Acute Institutional Stay (SKILLED_NURSING_FACILITY): Payer: Medicaid Other | Admitting: Adult Health

## 2017-01-23 ENCOUNTER — Encounter: Payer: Self-pay | Admitting: Adult Health

## 2017-01-23 DIAGNOSIS — E1142 Type 2 diabetes mellitus with diabetic polyneuropathy: Secondary | ICD-10-CM

## 2017-01-23 DIAGNOSIS — IMO0002 Reserved for concepts with insufficient information to code with codable children: Secondary | ICD-10-CM

## 2017-01-23 DIAGNOSIS — K219 Gastro-esophageal reflux disease without esophagitis: Secondary | ICD-10-CM

## 2017-01-23 DIAGNOSIS — E1143 Type 2 diabetes mellitus with diabetic autonomic (poly)neuropathy: Secondary | ICD-10-CM

## 2017-01-23 DIAGNOSIS — K5909 Other constipation: Secondary | ICD-10-CM | POA: Diagnosis not present

## 2017-01-23 DIAGNOSIS — J449 Chronic obstructive pulmonary disease, unspecified: Secondary | ICD-10-CM

## 2017-01-23 DIAGNOSIS — E1165 Type 2 diabetes mellitus with hyperglycemia: Secondary | ICD-10-CM

## 2017-01-23 NOTE — Progress Notes (Signed)
_ Location:   [facility] Room Number: 128 A Place of Service:      CODE STATUS: Full Code  Allergies  Allergen Reactions  . Metformin And Related Other (See Comments)    GI Distress    Chief Complaint  Patient presents with  . Medical Management of Chronic Issues    Copd; gerd; diabetes; peripheral neuropathy; constipation    HPI: She is a 61 year old short term resident of this facility being seen for the management of her chronic illnesses: copd; gerd; diabetes; peripheral neuropathy; and constipation. She is denying any shortness of breath; no heart burn and no complaints of constipation. She continues to get out of bed daily. There are no nursing concerns at this time.    Past Medical History:  Diagnosis Date  . Bipolar 1 disorder ([place])   . COPD (chronic obstructive pulmonary disease) ([place])   . Diabetes mellitus   . Fall at home   . Hypertension   . Intellectual disability   . Pancreatitis   . Schizoaffective disorder, bipolar type [facility])     Past Surgical History:  Procedure Laterality Date  . arm surgery    . i/d left foot Left 12/03/2016  . orif foot Left 12/03/2016    Social History   Social History  . Marital status: Married    Spouse name: N/A  . Number of children: N/A  . Years of education: N/A   Occupational History  . Not on file.   Social History Main Topics  . Smoking status: Current Every Day Smoker    Packs/day: 1.00    Years: 5.00    Types: Cigarettes  . Smokeless tobacco: Never Used  . Alcohol use 0.0 oz/week    2 - 5 Glasses of wine per week     Comment: unknown/pt confused  . Drug use: Yes    Types: Marijuana  . Sexual activity: No   Other Topics Concern  . Not on file   Social History Narrative  . No narrative on file   History reviewed. No pertinent family history.    VITAL SIGNS BP 138/78   Pulse 79   Temp (!) 97.2 F (36.2 C)   Resp 18   Ht '5\' 3"'$  (1.6 m)   Wt 231 lb 9.6 oz (105.1 kg)   LMP  04/19/2011   SpO2 96%   BMI 41.03 kg/m   Patient's Medications  New Prescriptions   No medications on file  Previous Medications   ACETAMINOPHEN (TYLENOL) 325 MG TABLET    Take 650 mg by mouth every 4 (four) hours as needed for mild pain. NOT TO EXCEED '3000MG'$  DAILY   ALBUTEROL (PROVENTIL HFA;VENTOLIN HFA) 108 (90 BASE) MCG/ACT INHALER    Inhale 2 puffs into the lungs every 6 (six) hours.   ALUMINUM-MAGNESIUM HYDROXIDE-SIMETHICONE (MAALOX) 185-631-49 MG/5ML SUSP    Take 30 mLs by mouth every 6 (six) hours as needed.   AMLODIPINE (NORVASC) 10 MG TABLET    Take 10 mg by mouth daily.   AMMONIUM LACTATE (LAC-HYDRIN) 12 % LOTION    Apply 1 application topically. Apply to affected area topically two times daily   ASENAPINE MALEATE 10 MG SUBL    Place 1 tablet under the tongue 2 (two) times daily.   ASPIRIN EC 81 MG TABLET    Take 81 mg by mouth daily.   BISACODYL (BISACODYL) 5 MG EC TABLET    Take 5 mg by mouth daily as needed for moderate constipation.  BUDESONIDE-FORMOTEROL (SYMBICORT) 160-4.5 MCG/ACT INHALER    Inhale 1 puff into the lungs 2 (two) times daily.   CARIPRAZINE HCL 3 MG CAPS    Take 1 capsule by mouth daily.   DEXTROSE (GLUTOSE) 40 % GEL    Give 15 gram by mouth as needed for hypoglycemia less than '60mg'$ /dl   DIPHENHYDRAMINE (BENADRYL) 25 MG TABLET    Give 2 tablets by mouth daily at bedtime as needed for insomnia   DOCUSATE SODIUM (COLACE) 100 MG CAPSULE    Take 100 mg by mouth 2 (two) times daily.   DONEPEZIL (ARICEPT) 5 MG TABLET    Take 5 mg by mouth at bedtime.   ENOXAPARIN (LOVENOX) 40 MG/0.4ML INJECTION    Inject 40 mg into the skin daily.    EXENATIDE ER 2 MG PEN    Inject 2 mg subcutaneously one time a day every Wednesday   GABAPENTIN (NEURONTIN) 300 MG CAPSULE    Take 300 mg by mouth 3 (three) times daily.   GLIMEPIRIDE (AMARYL) 4 MG TABLET    Take 4 mg by mouth 2 (two) times daily.   GUAIFENESIN (ROBAFEN) 100 MG/5ML SYRUP    Give 5 ml by mouth every 6 hours as needed  for cough   IBUPROFEN (ADVIL,MOTRIN) 600 MG TABLET    Take 600 mg by mouth every 8 (eight) hours as needed for mild pain.   INSULIN ASPART (NOVOLOG FLEXPEN) 100 UNIT/ML FLEXPEN    Inject 10 Units into the skin 3 (three) times daily with meals.   INSULIN GLARGINE (LANTUS) 100 UNIT/ML SOPN    Inject 45 Units into the skin daily.   IPRATROPIUM-ALBUTEROL (COMBIVENT) 20-100 MCG/ACT AERS RESPIMAT    Inhale 1 puff into the lungs every 6 (six) hours.   IPRATROPIUM-ALBUTEROL (DUONEB) 0.5-2.5 (3) MG/3ML SOLN    Take 3 mLs by nebulization every 4 (four) hours as needed.   LISINOPRIL (PRINIVIL,ZESTRIL) 10 MG TABLET    Take 10 mg by mouth daily.    LOPERAMIDE (IMODIUM A-D) 2 MG TABLET    Take 2 mg by mouth daily as needed for diarrhea or loose stools.   MAGNESIUM HYDROXIDE (MILK OF MAGNESIA) 400 MG/5ML SUSPENSION    Take 30 mLs by mouth daily as needed for mild constipation.   MELATONIN 3 MG TABS    Take 1 tablet by mouth at bedtime.   OMEPRAZOLE (PRILOSEC) 20 MG CAPSULE    Take 20 mg by mouth 2 (two) times daily.   POLYETHYLENE GLYCOL (MIRALAX / GLYCOLAX) PACKET    Take 17 g by mouth daily.   PROPRANOLOL (INDERAL) 20 MG TABLET    Take 20 mg by mouth 2 (two) times daily.   QUETIAPINE (SEROQUEL XR) 400 MG 24 HR TABLET    Take 400 mg by mouth at bedtime.   SERTRALINE (ZOLOFT) 100 MG TABLET    Take 100 mg by mouth daily.   TOPIRAMATE (TOPAMAX) 50 MG TABLET    Take 50 mg by mouth 2 (two) times daily.   TRAMADOL (ULTRAM) 50 MG TABLET    Take 1 tablet (50 mg total) by mouth every 6 (six) hours as needed for severe pain.  Modified Medications   No medications on file  Discontinued Medications   ALBUTEROL (PROVENTIL HFA;VENTOLIN HFA) 108 (90 BASE) MCG/ACT INHALER    Inhale 2 puffs into the lungs every 6 (six) hours.   INSULIN LISPRO (HUMALOG KWIKPEN) 100 UNIT/ML KIWKPEN    Inject 10 Units into the skin. Three times daily with meals  SIGNIFICANT DIAGNOSTIC EXAMS  NO NEW EXAMS  LABS REVIEWED TODAY:    12-14-16: wbc 6.4; hgb 12.4; hct 38.4; mcv 91.2; plt 254; glucose 201; bun 19.3; creat 1.00; k+ 4.3; na++ 142; ca 9.2; alt 303; ast 221; alk phos 187   Review of Systems  Constitutional: Negative for malaise/fatigue.  Respiratory: Negative for cough and shortness of breath.   Cardiovascular: Negative for chest pain, palpitations and leg swelling.  Gastrointestinal: Negative for abdominal pain, constipation and heartburn.  Musculoskeletal: Negative for back pain, joint pain and myalgias.  Skin: Negative.   Neurological: Negative for dizziness.  Psychiatric/Behavioral: The patient is not nervous/anxious.    Physical Exam  Constitutional: She is oriented to person, place, and time. She appears well-developed and well-nourished. No distress.  Eyes: Conjunctivae are normal.  Neck: Normal range of motion. Neck supple. No thyromegaly present.  Cardiovascular: Normal rate, regular rhythm, normal heart sounds and intact distal pulses.   Pulmonary/Chest: Effort normal and breath sounds normal. No respiratory distress.  Abdominal: Soft. Bowel sounds are normal. She exhibits no distension. There is no tenderness.  Musculoskeletal: She exhibits no edema.  Is able to move all extremities camboot on left lower extremity  Lymphadenopathy:    She has no cervical adenopathy.  Neurological: She is alert and oriented to person, place, and time.  Skin: Skin is warm. She is not diaphoretic.     ASSESSMENT/ PLAN:  TODAY:   1. COPD: is stable will continue albuterol 2 puffs every 6 hours; symbicort 150/4.5 mcg 2 puffs twice daily; combivent 1 puff every 6 hours and duoneb every 4 hours as needed  2. GERD: stable will continue prilosec 20 mg twice daily   3. Diabetes: is stable will continue lantus 45 units nightly; humalog 10 units with meals; exenatide  2 mg every Wednesday; amaryl 4 mg twice daily  Will monitor   4. Peripheral neuropathy: stable will continue neurontin 300 mg three times daily    5. Constipation: stable  will continue colace twice daily miralax daily   PREVIOUS  6.  Hypertension: is worse b/p 132/92 will continue asa 81 mg daily  inderal 20 mg twice daily  norvasc 10 mg daily will increase to  lisinopril 10 mg daily   7.  Schizoaffective disorder bipolar type: has depression: is presently stable on a complex regimen: saphris 10 mg twice daily; vraylar 3 mg daily haldol 0.5 mg three times daily; topamax 50 mg twice daily to help stabilize mood and zoloft 100 mg daily and takes melatonin 3 mg nightly for sleep  8. Vascular dementia: is without change will continue aricept 5 mg nightly   9. Left foot fracture, open: status post ORIF and I/D of the open fracture: is stable;  will continue to follow up orthopedics as indicated; will continue therapy as directed; has tylenol 650 mg every 4 hours as needed; ultram 50 mg every 6 hours as needed and motrin 800 mg every 8 hours as needed.     MD is aware of resident's narcotic use and is in agreement with current plan of care. We will attempt to wean resident as apropriate     Ok Edwards NP [facility]  Contact [phone] Monday through Friday 8am- 5pm  After hours call [phone]

## 2017-01-24 LAB — BASIC METABOLIC PANEL
BUN: 12 (ref 4–21)
Creatinine: 1.1 (ref 0.5–1.1)
Glucose: 199
Potassium: 4.9 (ref 3.4–5.3)
Sodium: 145 (ref 137–147)

## 2017-01-24 LAB — HEPATIC FUNCTION PANEL
ALT: 16 (ref 7–35)
AST: 16 (ref 13–35)
Alkaline Phosphatase: 144 — AB (ref 25–125)
BILIRUBIN, TOTAL: 0.2

## 2017-02-02 DIAGNOSIS — R7989 Other specified abnormal findings of blood chemistry: Secondary | ICD-10-CM | POA: Insufficient documentation

## 2017-02-02 DIAGNOSIS — Z9889 Other specified postprocedural states: Secondary | ICD-10-CM | POA: Insufficient documentation

## 2017-02-02 DIAGNOSIS — R945 Abnormal results of liver function studies: Secondary | ICD-10-CM | POA: Insufficient documentation

## 2017-02-02 DIAGNOSIS — Z8781 Personal history of (healed) traumatic fracture: Secondary | ICD-10-CM

## 2017-02-02 DIAGNOSIS — E1140 Type 2 diabetes mellitus with diabetic neuropathy, unspecified: Secondary | ICD-10-CM | POA: Insufficient documentation

## 2017-02-03 DEATH — deceased

## 2017-02-05 ENCOUNTER — Telehealth: Payer: Self-pay | Admitting: *Deleted

## 2017-02-05 NOTE — Telephone Encounter (Signed)
Informed patient daughter about autopsy procedure, she stated that she would call the medical examiner's office.

## 2017-02-05 NOTE — Telephone Encounter (Signed)
-----   Message from [place], [place] sent at 02/03/2017 11:10 AM EDT ----- Regarding: RE: Autopsy She does not need a physician order to obtain autopsy. She needs to request autopsy from medical examiner's office. Thanks  ----- Message ----- From: Lamont Snowball, RMA Sent: 02/03/2017  10:52 AM To: Kirt Boys, DO Subject: Autopsy                                        Good Morning, daughter of deceased patient would like to talk with you regarding her mothers death (requesting an autopsy). She stated that she was told that she would have to get permission from the physician. Daughter(Melinda) [phone] or [phone].
